# Patient Record
Sex: Male | Born: 1980
Health system: Southern US, Community
[De-identification: ages and names within clinical notes are randomized; demographics above are authoritative.]

## PROBLEM LIST (undated history)

## (undated) DIAGNOSIS — F32A Depression, unspecified: Secondary | ICD-10-CM

## (undated) DIAGNOSIS — R7302 Impaired glucose tolerance (oral): Secondary | ICD-10-CM

## (undated) DIAGNOSIS — E559 Vitamin D deficiency, unspecified: Secondary | ICD-10-CM

## (undated) DIAGNOSIS — E291 Testicular hypofunction: Secondary | ICD-10-CM

## (undated) DIAGNOSIS — K219 Gastro-esophageal reflux disease without esophagitis: Secondary | ICD-10-CM

## (undated) DIAGNOSIS — J45909 Unspecified asthma, uncomplicated: Secondary | ICD-10-CM

## (undated) DIAGNOSIS — R7989 Other specified abnormal findings of blood chemistry: Secondary | ICD-10-CM

## (undated) DIAGNOSIS — F329 Major depressive disorder, single episode, unspecified: Secondary | ICD-10-CM

## (undated) DIAGNOSIS — R945 Abnormal results of liver function studies: Principal | ICD-10-CM

## (undated) DIAGNOSIS — I10 Essential (primary) hypertension: Secondary | ICD-10-CM

## (undated) DIAGNOSIS — F419 Anxiety disorder, unspecified: Secondary | ICD-10-CM

## (undated) DIAGNOSIS — F988 Other specified behavioral and emotional disorders with onset usually occurring in childhood and adolescence: Secondary | ICD-10-CM

## (undated) HISTORY — PX: OTHER SURGICAL HISTORY: SHX169

## (undated) HISTORY — DX: Major depressive disorder, single episode, unspecified: F32.9

## (undated) HISTORY — DX: Unspecified asthma, uncomplicated: J45.909

## (undated) HISTORY — DX: Depression, unspecified: F32.A

## (undated) HISTORY — DX: Anxiety disorder, unspecified: F41.9

## (undated) HISTORY — DX: Impaired glucose tolerance (oral): R73.02

## (undated) HISTORY — DX: Other specified abnormal findings of blood chemistry: R79.89

## (undated) HISTORY — DX: Abnormal results of liver function studies: R94.5

## (undated) HISTORY — DX: Essential (primary) hypertension: I10

## (undated) HISTORY — DX: Vitamin D deficiency, unspecified: E55.9

## (undated) HISTORY — DX: Other specified behavioral and emotional disorders with onset usually occurring in childhood and adolescence: F98.8

## (undated) HISTORY — DX: Testicular hypofunction: E29.1

## (undated) HISTORY — DX: Gastro-esophageal reflux disease without esophagitis: K21.9

---

## 2000-03-28 ENCOUNTER — Encounter: Payer: Self-pay | Admitting: Family Medicine

## 2000-03-28 ENCOUNTER — Ambulatory Visit (HOSPITAL_COMMUNITY): Admission: RE | Admit: 2000-03-28 | Discharge: 2000-03-28 | Payer: Self-pay | Admitting: Family Medicine

## 2003-08-23 ENCOUNTER — Emergency Department (HOSPITAL_COMMUNITY): Admission: EM | Admit: 2003-08-23 | Discharge: 2003-08-23 | Payer: Self-pay | Admitting: Emergency Medicine

## 2006-08-03 ENCOUNTER — Emergency Department (HOSPITAL_COMMUNITY): Admission: EM | Admit: 2006-08-03 | Discharge: 2006-08-03 | Payer: Self-pay | Admitting: Family Medicine

## 2010-02-07 ENCOUNTER — Ambulatory Visit: Payer: Self-pay | Admitting: Diagnostic Radiology

## 2010-03-17 ENCOUNTER — Emergency Department (HOSPITAL_BASED_OUTPATIENT_CLINIC_OR_DEPARTMENT_OTHER): Admission: EM | Admit: 2010-03-17 | Discharge: 2010-02-07 | Payer: Self-pay | Admitting: Emergency Medicine

## 2010-06-22 LAB — CBC
HCT: 46.2 % (ref 39.0–52.0)
Hemoglobin: 16 g/dL (ref 13.0–17.0)
MCH: 30.1 pg (ref 26.0–34.0)
MCHC: 34.7 g/dL (ref 30.0–36.0)
MCV: 86.6 fL (ref 78.0–100.0)
Platelets: 268 10*3/uL (ref 150–400)
RBC: 5.33 MIL/uL (ref 4.22–5.81)
RDW: 13.1 % (ref 11.5–15.5)
WBC: 10.2 10*3/uL (ref 4.0–10.5)

## 2010-06-22 LAB — DIFFERENTIAL
Basophils Absolute: 0 10*3/uL (ref 0.0–0.1)
Basophils Relative: 0 % (ref 0–1)
Lymphocytes Relative: 17 % (ref 12–46)
Monocytes Absolute: 0.6 10*3/uL (ref 0.1–1.0)
Neutro Abs: 7.4 10*3/uL (ref 1.7–7.7)

## 2010-06-22 LAB — COMPREHENSIVE METABOLIC PANEL
Alkaline Phosphatase: 55 U/L (ref 39–117)
BUN: 26 mg/dL — ABNORMAL HIGH (ref 6–23)
CO2: 19 mEq/L (ref 19–32)
Chloride: 110 mEq/L (ref 96–112)
Creatinine, Ser: 1.4 mg/dL (ref 0.4–1.5)
GFR calc non Af Amer: 60 mL/min — ABNORMAL LOW (ref >60)
Glucose, Bld: 98 mg/dL (ref 70–99)
Potassium: 3.9 mEq/L (ref 3.5–5.1)
Total Bilirubin: 0.6 mg/dL (ref 0.3–1.2)

## 2011-08-10 ENCOUNTER — Encounter: Payer: Self-pay | Admitting: Internal Medicine

## 2011-08-31 ENCOUNTER — Encounter: Payer: Self-pay | Admitting: Internal Medicine

## 2011-09-05 ENCOUNTER — Other Ambulatory Visit: Payer: Self-pay | Admitting: Internal Medicine

## 2011-09-05 ENCOUNTER — Ambulatory Visit: Payer: BC Managed Care – PPO

## 2011-09-05 ENCOUNTER — Encounter: Payer: Self-pay | Admitting: Internal Medicine

## 2011-09-05 ENCOUNTER — Ambulatory Visit (INDEPENDENT_AMBULATORY_CARE_PROVIDER_SITE_OTHER): Payer: BC Managed Care – PPO | Admitting: Internal Medicine

## 2011-09-05 VITALS — BP 120/70 | HR 75 | Ht 69.0 in | Wt 183.8 lb

## 2011-09-05 DIAGNOSIS — R7401 Elevation of levels of liver transaminase levels: Secondary | ICD-10-CM

## 2011-09-05 DIAGNOSIS — E291 Testicular hypofunction: Secondary | ICD-10-CM

## 2011-09-05 DIAGNOSIS — R7989 Other specified abnormal findings of blood chemistry: Secondary | ICD-10-CM

## 2011-09-05 LAB — CBC WITH DIFFERENTIAL/PLATELET
Basophils Absolute: 0 10*3/uL (ref 0.0–0.1)
HCT: 46.7 % (ref 39.0–52.0)
Lymphs Abs: 0.8 10*3/uL (ref 0.7–4.0)
MCV: 90.2 fl (ref 78.0–100.0)
Monocytes Absolute: 0.4 10*3/uL (ref 0.1–1.0)
Neutrophils Relative %: 76.9 % (ref 43.0–77.0)
Platelets: 206 10*3/uL (ref 150.0–400.0)
RDW: 14.2 % (ref 11.5–14.6)
WBC: 6.1 10*3/uL (ref 4.5–10.5)

## 2011-09-05 LAB — IGA: IgA: 299 mg/dL (ref 68–378)

## 2011-09-05 LAB — IBC PANEL
Iron: 68 ug/dL (ref 42–165)
Saturation Ratios: 15.2 % — ABNORMAL LOW (ref 20.0–50.0)
Transferrin: 319.7 mg/dL (ref 212.0–360.0)

## 2011-09-05 LAB — HEPATIC FUNCTION PANEL
Bilirubin, Direct: 0.1 mg/dL (ref 0.0–0.3)
Total Bilirubin: 0.6 mg/dL (ref 0.3–1.2)

## 2011-09-05 LAB — PROTIME-INR: INR: 1 ratio (ref 0.8–1.0)

## 2011-09-05 LAB — FERRITIN: Ferritin: 50.6 ng/mL (ref 22.0–322.0)

## 2011-09-05 MED ORDER — ALIGN 4 MG PO CAPS: 1.0000 | ORAL_CAPSULE | Freq: Every day | ORAL | Status: DC

## 2011-09-05 NOTE — Patient Instructions (Signed)
You have been scheduled for an abdominal Ultrasound at [facility] on 5.30.2013  @ 9:00am  Please arrive 15 min prior to your appointment. DO NOT HAVE ANYTHING TO EAT OR DRINK AFTER MIDNIGHT before you appointment.  If you cannot make this appointment please call [phone].  You have been given samples of Align, you can purchase these over the counter at your local Drug store.  Dr. Rhea Belton would like you to stop using: Purify and Dominica.

## 2011-09-06 ENCOUNTER — Encounter: Payer: Self-pay | Admitting: Internal Medicine

## 2011-09-06 DIAGNOSIS — F419 Anxiety disorder, unspecified: Secondary | ICD-10-CM | POA: Insufficient documentation

## 2011-09-06 DIAGNOSIS — E291 Testicular hypofunction: Secondary | ICD-10-CM | POA: Insufficient documentation

## 2011-09-06 LAB — ANA: Anti Nuclear Antibody(ANA): NEGATIVE

## 2011-09-06 LAB — IRON AND TIBC
%SAT: 17 % — ABNORMAL LOW (ref 20–55)
TIBC: 411 ug/dL (ref 215–435)
UIBC: 341 ug/dL (ref 125–400)

## 2011-09-06 LAB — IGG: IgG (Immunoglobin G), Serum: 1270 mg/dL (ref 650–1600)

## 2011-09-06 LAB — CERULOPLASMIN: Ceruloplasmin: 28 mg/dL (ref 20–60)

## 2011-09-06 NOTE — Progress Notes (Signed)
Subjective:    Patient ID: Darren Freeman, male    DOB: 06/02/1980, 31 y.o.   MRN: [id number]  HPI Mr. Betzold is a 31 yo male with PMH of hypertension, anxiety, and hypogonadism with low testosterone who seen in consultation at the request of Dr. Oneta Rack for evaluation of elevated LFTs. The patient reports today that he feels well and is without symptoms. He has no prior history of abnormal liver tests to his knowledge. He reports that he has no specific GI symptoms other than frequent and foul-smelling gas. He reports no abdominal pain. No nausea or vomiting. No diarrhea or constipation. He's had no blood in his stools and no melena. No fevers and chills. Overall energy levels are good. His appetite remains good. He denies early satiety.  From a liver standpoint, he has no history of ascites, lower extremity edema, or jaundice. He has no family history of liver disease. He notes his mother has a history of diverticulosis and subsequent diverticulitis. No family history of celiac disease. No family history of colorectal cancer.  The patient was asked regarding over-the-counter and herbal supplementations. He is currently taking a medication called Purify which he describes as like a nightly detoxification. He is also taking a product called Dominica which he reports is a collection of vitamins and minerals.  Review of Systems As per history of present illness, otherwise notable for anxiety, occasional back pain, occasional headaches, and occasional sleeping problems.  Past Medical History  Diagnosis Date  . Hypogonadism male   . HTN (hypertension)   . Anxiety   . Elevated LFTs    Past Surgical History  Procedure Date  . Neg hx    Current Outpatient Prescriptions  Medication Sig Dispense Refill  . ALPHA LIPOIC ACID PO Take by mouth daily.      . Ascorbic Acid (VITAMIN C) 1000 MG tablet Take 1,000 mg by mouth daily. 1-2 qd      . buPROPion (WELLBUTRIN XL) 300 MG 24 hr tablet  Take 300 mg by mouth daily.      Marland Kitchen co-enzyme Q-10 30 MG capsule Take 30 mg by mouth daily.      . Fish Oil-Cholecalciferol (FISH OIL + D3) 1200-1000 MG-UNIT CAPS Take by mouth.      . lisdexamfetamine (VYVANSE) 40 MG capsule Take 40 mg by mouth every morning.      . Multiple Vitamin (MULTIVITAMIN) tablet Take 1 tablet by mouth daily.      Marland Kitchen OVER THE COUNTER MEDICATION Liver detox herbal supp      . TESTOSTERONE IM Inject into the muscle. 200mg  1.5cc q 3 weeks      . Probiotic Product (ALIGN) 4 MG CAPS Take 1 capsule by mouth daily.  30 capsule  2   No Known Allergies  Family History  Problem Relation Age of Onset  . Colon cancer Neg Hx   . Breast cancer Paternal Grandmother   ' History  Substance Use Topics  . Smoking status: Former Games developer  . Smokeless tobacco: Never Used  . Alcohol Use: No   patient works at a [employer] as a Production designer, theatre/television/film. He is not married. He has no children. He denies alcohol use completely, but notes several years ago he did drink a regular basis. He denies illicit drug use.      Objective:   Physical Exam BP 120/70  Pulse 75  Ht 5\' 9"  (1.753 m)  Wt 183 lb 12.8 oz (83.371 kg)  BMI 27.14 kg/m2  Constitutional: Well-developed and well-nourished. No distress. HEENT: Normocephalic and atraumatic. Oropharynx is clear and moist. No oropharyngeal exudate. Conjunctivae are normal. Pupils are equal round and reactive to light. No scleral icterus. Neck: Neck supple. Trachea midline. Cardiovascular: Normal rate, regular rhythm and intact distal pulses. No M/R/G Pulmonary/chest: Effort normal and breath sounds normal. No wheezing, rales or rhonchi. Abdominal: Soft, nontender, nondistended. Bowel sounds active throughout. There are no masses palpable. No hepatosplenomegaly. Extremities: no clubbing, cyanosis, or edema Lymphadenopathy: No cervical adenopathy noted. Neurological: Alert and oriented to person place and time. Skin: Skin is warm and dry. No  rashes noted. Psychiatric: Normal mood and affect. Behavior is normal.  CBC    Component Value Date/Time   WBC 6.1 09/05/2011 0954   RBC 5.19 09/05/2011 0954   HGB 15.4 09/05/2011 0954   HCT 46.7 09/05/2011 0954   PLT 206.0 09/05/2011 0954   MCV 90.2 09/05/2011 0954   MCH 30.1 02/07/2010 0131   MCHC 33.0 09/05/2011 0954   RDW 14.2 09/05/2011 0954   LYMPHSABS 0.8 09/05/2011 0954   MONOABS 0.4 09/05/2011 0954   EOSABS 0.1 09/05/2011 0954   BASOSABS 0.0 09/05/2011 0954   Labs obtained from his primary provider, dated 08/02/2011 Hepatitis C antibody negative, hepatitis Be antibody negative, hepatitis B surface antibody positive, hepatitis A antibody total negative, hepatitis B core antibody total negative Hematocrit 47.9, MCV 88.1, platelet count 252 Sodium 137, potassium 4.8, chloride 105, carbon dioxide 21, BUN 30, creatinine 1.27, glucose 82, calcium 9.6 Total bilirubin 0.4, alk phosphatase 32, AST 128, ALT 138, total protein 7.7, albumin 4.8 Technetium 2.2 TSH 1.496 Hemoglobin A1c 5.3 Testosterone 768.27 (reference range 300-890) Vitamin D 43  Of note labs dated 12/06/2010 -- AST 50, ALT 49, testosterone low at 36  Remote imaging reviewed, CT abdomen with contrast dated 02/07/2010 Negative CT abdomen. Tiny nonspecific low attenuation focus centrally and liver 6 mm diameter, too small to characterize. Liver, spleen, pancreas, kidneys, and adrenal glands otherwise unremarkable. No mass, adenopathy, or free fluid.     Assessment & Plan:  31 yo male with PMH of hypertension, anxiety, and hypogonadism with low testosterone who seen in consultation at the request of Dr. Oneta Rack for evaluation of elevated LFTs.  1. Elevated liver transaminases -- the patient has no signs of chronic liver disease, but he has had a relatively persistent transaminitis. His AST and ALT were elevated in October of 2011 during your visit to the emergency department. Of note he was taking testosterone, at that time not  by prescription for bodybuilding purposes. He also reports alcohol use during this period in late 2011. His current pattern of hepatocellular injury is not consistent with alcoholic hepatitis, and this is supported by the fact that he has a persistent transaminitis and no longer drinks alcohol. At this time, I feel the most likely etiology to his elevated transaminases continues to be drug related, specifically testosterone. It is interesting, that during a period in 2012 when he was off testosterone, his transaminases normalized. For today, I will perform additional testing to exclude other possible causes of liver inflammation, such as iron storage disease, Wilson's disease, autoimmune hepatitis, etc. I also have recommended a liver ultrasound with Doppler. Once these results are available, we will meet again and decide how to proceed. We discussed how at present his liver functions normally and I feel that he is at very low risk for decompensation at present. However, we certainly do not want this inflammation around unchecked for a long period.  I recommended that he discontinue all nonessential medications, including his over-the-counter vitamin supplementation. He voices understanding. I've also recommended hepatitis A vaccine, as he is not currently immune. We will give hepatitis A vaccine starting today.

## 2011-09-07 ENCOUNTER — Other Ambulatory Visit (HOSPITAL_COMMUNITY): Payer: BC Managed Care – PPO

## 2011-09-07 LAB — MITOCHONDRIAL ANTIBODIES: Mitochondrial M2 Ab, IgG: 0.65 (ref <0.91)

## 2011-09-12 ENCOUNTER — Other Ambulatory Visit (HOSPITAL_COMMUNITY): Payer: BC Managed Care – PPO

## 2011-09-21 ENCOUNTER — Other Ambulatory Visit: Payer: Self-pay | Admitting: *Deleted

## 2011-09-21 DIAGNOSIS — R7989 Other specified abnormal findings of blood chemistry: Secondary | ICD-10-CM

## 2011-09-21 DIAGNOSIS — R945 Abnormal results of liver function studies: Secondary | ICD-10-CM

## 2011-09-26 LAB — HIV-1 GENOTYPR PLUS

## 2011-11-17 ENCOUNTER — Telehealth: Payer: Self-pay | Admitting: *Deleted

## 2011-11-17 NOTE — Telephone Encounter (Signed)
lmom for pt to call back. Pt needs repeat labs.

## 2011-11-17 NOTE — Telephone Encounter (Signed)
Message copied by Florene Glen on Fri Nov 17, 2011  3:55 PM ------      Message from: Daphine Deutscher      Created: Thu Sep 21, 2011  4:16 PM       Call and remind patient due for LFT, coags for JMP on 11/20/11

## 2011-11-22 NOTE — Telephone Encounter (Signed)
lmom for pt to call back

## 2011-12-08 NOTE — Telephone Encounter (Signed)
Never heard back from pt; mailed him a letter requesting he come in for repeat LFT's.

## 2012-06-24 ENCOUNTER — Telehealth: Payer: Self-pay

## 2012-06-24 NOTE — Telephone Encounter (Signed)
Message copied by Annett Fabian on Mon Jun 24, 2012  9:15 AM ------      Message from: Annett Fabian      Created: Tue May 21, 2012  2:53 PM       Patient only received 1 of the Hep A injections please see if he needs to complete the series ------

## 2012-06-24 NOTE — Telephone Encounter (Signed)
Per Dr. Rhea Belton patient should complete the series and get a hep a antibody total in 08/24/12 I have left a message for the patient to call back to schedule injection #2

## 2012-07-08 NOTE — Telephone Encounter (Signed)
No return call from patient and no machine when I attempted to contact him today to leave a message.  I have mailed him a letter asking he call us to schedule his second Hep A shot and labs after.

## 2012-08-12 ENCOUNTER — Other Ambulatory Visit: Payer: Self-pay | Admitting: Gastroenterology

## 2012-08-12 ENCOUNTER — Telehealth: Payer: Self-pay | Admitting: Internal Medicine

## 2012-08-12 DIAGNOSIS — R7989 Other specified abnormal findings of blood chemistry: Secondary | ICD-10-CM

## 2012-08-12 NOTE — Telephone Encounter (Signed)
See phone note 06/24/12, he is due for Hep A # 2 now and will need labs after.  I have left a message for him to call and discuss

## 2012-08-12 NOTE — Telephone Encounter (Signed)
Pt called in scheduled his hep inj #2 for 08/13/2012 @ 2:00pm I also let him know that he is to go to the lab as well. Pt verbalized understanding

## 2012-08-13 ENCOUNTER — Other Ambulatory Visit (INDEPENDENT_AMBULATORY_CARE_PROVIDER_SITE_OTHER): Payer: BC Managed Care – PPO

## 2012-08-13 ENCOUNTER — Ambulatory Visit: Payer: BC Managed Care – PPO | Admitting: Internal Medicine

## 2012-08-13 ENCOUNTER — Other Ambulatory Visit: Payer: Self-pay | Admitting: Gastroenterology

## 2012-08-13 DIAGNOSIS — R7989 Other specified abnormal findings of blood chemistry: Secondary | ICD-10-CM

## 2012-08-13 DIAGNOSIS — R945 Abnormal results of liver function studies: Secondary | ICD-10-CM

## 2012-08-13 LAB — IRON AND TIBC
%SAT: 18 % — ABNORMAL LOW (ref 20–55)
Iron: 84 ug/dL (ref 42–165)

## 2012-08-13 LAB — HEPATIC FUNCTION PANEL
AST: 80 U/L — ABNORMAL HIGH (ref 0–37)
Bilirubin, Direct: 0.1 mg/dL (ref 0.0–0.3)
Total Bilirubin: 1 mg/dL (ref 0.3–1.2)

## 2012-08-14 LAB — PT AND PTT: INR: 1.1 (ref 0.8–1.2)

## 2012-08-16 ENCOUNTER — Encounter: Payer: Self-pay | Admitting: Internal Medicine

## 2012-08-20 ENCOUNTER — Ambulatory Visit: Payer: BC Managed Care – PPO | Admitting: Internal Medicine

## 2012-08-20 ENCOUNTER — Telehealth: Payer: Self-pay | Admitting: Internal Medicine

## 2012-08-21 ENCOUNTER — Encounter: Payer: Self-pay | Admitting: Internal Medicine

## 2012-08-22 ENCOUNTER — Encounter: Payer: Self-pay | Admitting: Internal Medicine

## 2012-08-22 ENCOUNTER — Ambulatory Visit (INDEPENDENT_AMBULATORY_CARE_PROVIDER_SITE_OTHER): Payer: BC Managed Care – PPO | Admitting: Internal Medicine

## 2012-08-22 VITALS — BP 130/84 | HR 72 | Ht 69.0 in | Wt 195.4 lb

## 2012-08-22 DIAGNOSIS — R7989 Other specified abnormal findings of blood chemistry: Secondary | ICD-10-CM

## 2012-08-22 DIAGNOSIS — R109 Unspecified abdominal pain: Secondary | ICD-10-CM

## 2012-08-22 MED ORDER — HYOSCYAMINE SULFATE 0.125 MG SL SUBL: 0.1250 mg | SUBLINGUAL_TABLET | SUBLINGUAL | Status: DC | PRN

## 2012-08-22 NOTE — Progress Notes (Signed)
Subjective:    Patient ID: Darren Freeman, male    DOB: 1980-12-26, 32 y.o.   MRN: [id number]  HPI Mr. Witherspoon is a 32 yo male with PMH of hypertension, anxiety, and hypogonadism with low testosterone, elevated serum transaminases who seen in followup.  I saw Jlon one year ago to evaluate his elevated AST and ALT. Lab evaluation did not reveal an etiology for his elevated liver enzymes. At that time he was felt possibly drug-induced, specifically testosterone. Today he is feeling well, though he is getting over a recent GI illness. He reports this effected his entire family involved fever, nausea with vomiting , and abdominal cramping. This is significantly better, though he still feels postprandial abdominal bloating. After his last visit he was using Align which he thought helped with his abdominal bloating. He's had no jaundice, lower extremity edema, abdominal pain or increased girth, itching, or bleeding.  No fevers, chills, rashes. He's had no alcohol consumption for over a year  Review of Systems As per history of present illness, otherwise negative  Current Medications, Allergies, Past Medical History, Past Surgical History, Family History and Social History were reviewed in [employer] record.     Objective:   Physical Exam BP 130/84  Pulse 72  Ht 5\' 9"  (1.753 m)  Wt 195 lb 6 oz (88.622 kg)  BMI 28.84 kg/m2 Constitutional: Well-developed and well-nourished. No distress. HEENT: Normocephalic and atraumatic.No scleral icterus. Abdominal: Soft, nontender, nondistended. Bowel sounds active throughout. There are no masses palpable. No hepatosplenomegaly. Extremities: no clubbing, cyanosis, or edema Neurological: Alert and oriented to person place and time. Skin: Skin is warm and dry. No rashes noted. Psychiatric: Normal mood and affect. Behavior is normal.  CBC    Component Value Date/Time   WBC 6.1 09/05/2011 0954   RBC 5.19 09/05/2011 0954   HGB  15.4 09/05/2011 0954   HCT 46.7 09/05/2011 0954   PLT 206.0 09/05/2011 0954   MCV 90.2 09/05/2011 0954   MCH 30.1 02/07/2010 0131   MCHC 33.0 09/05/2011 0954   RDW 14.2 09/05/2011 0954   LYMPHSABS 0.8 09/05/2011 0954   MONOABS 0.4 09/05/2011 0954   EOSABS 0.1 09/05/2011 0954   BASOSABS 0.0 09/05/2011 0954    INR 1.1  Results for ESCHER, HARR (MRN [id number]) as of 08/22/2012 13:16  Ref. Range 02/07/2010 01:31 09/05/2011 09:54 09/05/2011 09:54 08/13/2012 14:10  Alkaline Phosphatase Latest Range: 39-117 U/L 55 26 (L)  41  Albumin Latest Range: 3.5-5.2 g/dL 4.7 3.8  4.0  AST Latest Range: 0-37 U/L 81 (H) 84 (H)  80 (H)  ALT Latest Range: 0-53 U/L 78 (H) 103 (H)  124 (H)  Total Protein Latest Range: 6.0-8.3 g/dL 8.2 7.4  7.3  Bilirubin, Direct Latest Range: 0.0-0.3 mg/dL  0.1  0.1  Total Bilirubin Latest Range: 0.3-1.2 mg/dL 0.6 0.6  1.0   Previous labs: Alpha-1-antitrypsin - normal IgG normal ANA negative Ferritin 50 Percent sat 17% Iron 70 TIBC 411 Tissue transglutaminase normal, IgA normal AMA negative Anti-smooth muscle antibody negative Ceruloplasmin 28      Assessment & Plan:  32 yo male with PMH of hypertension, anxiety, and hypogonadism with low testosterone, elevated serum transaminases who seen in followup.  1.  Elevated liver enzymes -- his liver enzymes have remained persistently elevated and AST at last check was 80, ALT 124.  There is no evidence of chronic liver disease/decompensation, portal hypertension, etc. My impression is this is still likely related to testosterone replacement. He  did try stopping this medication in the past, and had significant fatigue, loss of stamina, et Karie Soda.  We discussed that chronic, low-grade liver inflammation, and results over time in scarring.  At this point, we discussed liver biopsy versus referral to Dr. Corky Sing for hepatology opinion.  He understands that even with hepatology opinion, he may need a liver biopsy. We will arrange for  him to be seen at [facility] by Dr. Esmond Harps.  He will continue to avoid alcohol and any over-the-counter herbal supplements.  2.  Resolving gastroenteritis, likely viral -- L2 and a prescription for Levsin to be used as needed and as directed for abdominal cramping/spasm. I do not anticipate he will need this for very long.  Align may also help this as he is getting over this infection.  He also seemed to have a favorable response to Align when used in the past for bloating and gas.  Return after opinion from Dr. Corky Sing

## 2012-08-22 NOTE — Patient Instructions (Addendum)
You will be referred to [facility] gastroenterology Dr. Corky Sing, his office will be contacting you.  If you do not hear from them by 08/30/2012 please call our office [phone] ext [phone]  Resume taking Align daily                                               We are excited to introduce MyChart, a new best-in-class service that provides you online access to important information in your electronic medical record. We want to make it easier for you to view your health information - all in one secure location - when and where you need it. We expect MyChart will enhance the quality of care and service we provide.  When you register for MyChart, you can:    View your test results.    Request appointments and receive appointment reminders via email.    Request medication renewals.    View your medical history, allergies, medications and immunizations.    Communicate with your physician's office through a password-protected site.    Conveniently print information such as your medication lists.  To find out if MyChart is right for you, please talk to a member of our clinical staff today. We will gladly answer your questions about this free health and wellness tool.  If you are age 49 or older and want a member of your family to have access to your record, you must provide written consent by completing a proxy form available at our office. Please speak to our clinical staff about guidelines regarding accounts for patients younger than age 72.  As you activate your MyChart account and need any technical assistance, please call the MyChart technical support line at [phone]-[phone]) or email your question to [email or web address]. If you email your question(s), please include your name, a return phone number and the best time to reach you.  If you have non-urgent health-related questions, you can send a message to our office through MyChart at [email or web address]. If you have a medical  emergency, call 911.  Thank you for using MyChart as your new health and wellness resource!   MyChart licensed from [employer],  [id number]. Patents Pending.

## 2012-08-23 ENCOUNTER — Telehealth: Payer: Self-pay | Admitting: Gastroenterology

## 2012-08-23 NOTE — Telephone Encounter (Signed)
Faxed records for referral to Dr. Corky Sing @ [facility]

## 2012-12-25 NOTE — Telephone Encounter (Signed)
PT NOT BILLED °

## 2013-01-08 ENCOUNTER — Other Ambulatory Visit: Payer: Self-pay | Admitting: Internal Medicine

## 2013-01-08 DIAGNOSIS — R0602 Shortness of breath: Secondary | ICD-10-CM

## 2013-01-08 DIAGNOSIS — I10 Essential (primary) hypertension: Secondary | ICD-10-CM

## 2013-01-08 DIAGNOSIS — L0231 Cutaneous abscess of buttock: Secondary | ICD-10-CM

## 2013-01-09 ENCOUNTER — Other Ambulatory Visit: Payer: BC Managed Care – PPO

## 2013-01-13 ENCOUNTER — Other Ambulatory Visit: Payer: BC Managed Care – PPO

## 2013-01-28 ENCOUNTER — Other Ambulatory Visit (HOSPITAL_COMMUNITY): Payer: Self-pay | Admitting: Internal Medicine

## 2013-01-28 ENCOUNTER — Ambulatory Visit (HOSPITAL_COMMUNITY)
Admission: RE | Admit: 2013-01-28 | Discharge: 2013-01-28 | Disposition: A | Discharge status: Home / Self Care | Payer: BC Managed Care – PPO | Source: Ambulatory Visit | Attending: Internal Medicine | Admitting: Internal Medicine

## 2013-01-28 DIAGNOSIS — M545 Low back pain, unspecified: Secondary | ICD-10-CM | POA: Insufficient documentation

## 2013-01-28 DIAGNOSIS — R0602 Shortness of breath: Secondary | ICD-10-CM

## 2013-01-28 DIAGNOSIS — M5137 Other intervertebral disc degeneration, lumbosacral region: Secondary | ICD-10-CM | POA: Insufficient documentation

## 2013-01-28 DIAGNOSIS — I319 Disease of pericardium, unspecified: Secondary | ICD-10-CM | POA: Insufficient documentation

## 2013-01-28 DIAGNOSIS — M4040 Postural lordosis, site unspecified: Secondary | ICD-10-CM | POA: Insufficient documentation

## 2013-01-28 DIAGNOSIS — M79609 Pain in unspecified limb: Secondary | ICD-10-CM

## 2013-01-28 DIAGNOSIS — M51379 Other intervertebral disc degeneration, lumbosacral region without mention of lumbar back pain or lower extremity pain: Secondary | ICD-10-CM | POA: Insufficient documentation

## 2013-01-28 DIAGNOSIS — I517 Cardiomegaly: Secondary | ICD-10-CM | POA: Insufficient documentation

## 2013-01-29 ENCOUNTER — Other Ambulatory Visit: Payer: Self-pay | Admitting: *Deleted

## 2013-01-29 ENCOUNTER — Encounter: Payer: Self-pay | Admitting: Internal Medicine

## 2013-01-29 ENCOUNTER — Ambulatory Visit (HOSPITAL_COMMUNITY): Payer: BC Managed Care – PPO | Attending: Internal Medicine | Admitting: Cardiology

## 2013-01-29 ENCOUNTER — Ambulatory Visit (INDEPENDENT_AMBULATORY_CARE_PROVIDER_SITE_OTHER): Payer: BC Managed Care – PPO | Admitting: Internal Medicine

## 2013-01-29 VITALS — BP 148/90 | HR 75 | Ht 69.0 in | Wt 199.5 lb

## 2013-01-29 DIAGNOSIS — I77819 Aortic ectasia, unspecified site: Secondary | ICD-10-CM | POA: Insufficient documentation

## 2013-01-29 DIAGNOSIS — R0602 Shortness of breath: Secondary | ICD-10-CM

## 2013-01-29 DIAGNOSIS — I10 Essential (primary) hypertension: Secondary | ICD-10-CM

## 2013-01-29 DIAGNOSIS — R0609 Other forms of dyspnea: Secondary | ICD-10-CM | POA: Insufficient documentation

## 2013-01-29 DIAGNOSIS — I517 Cardiomegaly: Secondary | ICD-10-CM

## 2013-01-29 DIAGNOSIS — I319 Disease of pericardium, unspecified: Secondary | ICD-10-CM

## 2013-01-29 DIAGNOSIS — I379 Nonrheumatic pulmonary valve disorder, unspecified: Secondary | ICD-10-CM | POA: Insufficient documentation

## 2013-01-29 DIAGNOSIS — I77810 Thoracic aortic ectasia: Secondary | ICD-10-CM

## 2013-01-29 DIAGNOSIS — R0989 Other specified symptoms and signs involving the circulatory and respiratory systems: Secondary | ICD-10-CM | POA: Insufficient documentation

## 2013-01-29 DIAGNOSIS — I059 Rheumatic mitral valve disease, unspecified: Secondary | ICD-10-CM | POA: Insufficient documentation

## 2013-01-29 MED ORDER — HYDROCHLOROTHIAZIDE 12.5 MG PO CAPS: 12.5000 mg | ORAL_CAPSULE | Freq: Every day | ORAL | Status: DC

## 2013-01-29 NOTE — Progress Notes (Signed)
Echo performed. 

## 2013-01-29 NOTE — Progress Notes (Signed)
Primary Care Physician: Nadean Corwin, MD Referring Physician:  Quentin Mulling PA   Darren Freeman is a 32 y.o. male with a h/o htn who presents for cardiology consultation.  He is a Pharmacist, community and works out frequent.  He also diets aggressively (lose 20 lbs in a week) and then after a body building competition he will eat a lot and gain 20 lbs back in a week.  He reports that his most recent competition was 3 weeks ago.  He lost 20 lbs for this competition and then over the past 2-3 weeks, he gained 25 lbs back.  His diet has changed and this over the past 2 weeks has included significant increase in carbohydrates and sodium.  He presented to Dr Kathryne Sharper office for symptoms of lethargy recently.  He was found to have high blood sugar (bs 400s) several weeks ago.  Over the past few days, he has also noticed an increase in SOB.  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  He continues to workout without difficulty.  He has had occasional headaches recently.  He reports occasional gradual onset/offset of heart palpitations but denies sustained or abrupt onset of tachypalpitations.  Today, he denies symptoms of  chest pain, orthopnea, PND, lower extremity edema, dizziness, presyncope, syncope, or neurologic sequela. The patient is tolerating medications without difficulties and is otherwise without complaint today.   Past Medical History  Diagnosis Date  . Hypogonadism male   . HTN (hypertension)   . Anxiety   . Elevated LFTs   . Glucose intolerance (impaired glucose tolerance)     previously on metformin   Past Surgical History  Procedure Laterality Date  . Neg hx      Current Outpatient Prescriptions  Medication Sig Dispense Refill  . ALPHA LIPOIC ACID PO Take by mouth 2 (two) times daily.       . Ascorbic Acid (VITAMIN C) 1000 MG tablet Take 1,000 mg by mouth daily. 1-2 qd      . buPROPion (WELLBUTRIN XL) 300 MG 24 hr tablet Take 300 mg by mouth  daily.      . Fish Oil-Cholecalciferol (FISH OIL + D3) 1200-1000 MG-UNIT CAPS Take by mouth 4 (four) times daily.       . hyoscyamine (LEVSIN SL) 0.125 MG SL tablet Place 1 tablet (0.125 mg total) under the tongue every 4 (four) hours as needed for cramping.  30 tablet  0  . Multiple Vitamin (MULTIVITAMIN) tablet Take 1 tablet by mouth daily.      . Probiotic Product (ALIGN) 4 MG CAPS Take 1 capsule by mouth daily.  30 capsule  2  . TESTOSTERONE IM Inject into the muscle. 200mg  1.5cc q 3 weeks      . trazodone (DESYREL) 300 MG tablet Take 150 mg by mouth at bedtime.      Marland Kitchen amphetamine-dextroamphetamine (ADDERALL) 20 MG tablet Take 10-20 mg by mouth 3 (three) times daily. Takes as needed       No current facility-administered medications for this visit.    No Known Allergies  History   Social History  . Marital Status: Single    Spouse Name: N/A    Number of Children: 0  . Years of Education: N/A   Occupational History  . manager [employer]    Social History Main Topics  . Smoking status: Former Games developer  . Smokeless tobacco: Never Used  . Alcohol Use: No     Comment: previously used cocaine (quit  x 2 years)  . Drug Use: Yes  . Sexual Activity: Not on file   Other Topics Concern  . 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 Colon cancer Neg Hx   . Breast cancer Paternal Grandmother     ROS- All systems are reviewed and negative except as per the HPI above  Physical Exam: Filed Vitals:   01/29/13 1704  BP: 148/90  Pulse: 75  Height: 5\' 9"  (1.753 m)  Weight: 199 lb 8 oz (90.493 kg)    GEN- The patient is well appearing, alert and oriented x 3 today.   Head- normocephalic, atraumatic Eyes-  Sclera clear, conjunctiva pink Ears- hearing intact Oropharynx- clear Neck- supple, no JVP Lymph- no cervical lymphadenopathy Lungs-  Clear to ausculation bilaterally, normal work of breathing Heart- Regular rate and rhythm, no murmurs, rubs or gallops, PMI not laterally displaced GI- soft, NT, ND, + BS Extremities- no clubbing, cyanosis, or edema MS- very fit Skin- no rash or lesion Psych- euthymic mood, full affect Neuro- strength and sensation are intact  EKG today reveals sinus rhythm 76 bpm, LVH Echo is reviewed and reveals mild LVH, preserved EF, mildly dilated aortic root  Assessment and Plan:  1. SOB The patient presents today for urgent add on visit for cardiology assessment of shortness of breath.  On exam today he is very fit but not significantly volume overloaded.  His BP is elevated.  He is stressing his body to extremes with weight loss prior to muscle building competitions followed by a very high carbohydrate and sodium diet with quick weight gain afterwards.  His extremes in diet and lifestyle have led to recent hyperglycemic events, elevated LFTs, and I believe have also caused his SOB and edema.  His echo today reveals mild LVH but preserved EF.  There is no evidence of cardiomyopathy.   I had a long discussion with the patient today about a more moderate approach to his lifestyle.  He is aware of the extremes that he is placing on his body and will try to make changes.  2. HTN Above goal Given recent swelling/ SOB with salt intake, I will start hctz 12.5mg  daily today He will return for follow-up with Tereso Newcomer in 6 weeks  3. Mildly dilated aortic root I have discouraged isometric exercise (especially extreme weight lifting) Repeat echo in 1 year  He will follow up with primary care as scheduled.

## 2013-01-29 NOTE — Patient Instructions (Addendum)
Your physician recommends that you schedule a follow-up appointment in: 6 weeks with Darren Freeman    Your physician has requested that you have an echocardiogram. Echocardiography is a painless test that uses sound waves to create images of your heart. It provides your doctor with information about the size and shape of your heart and how well your heart's chambers and valves are working. This procedure takes approximately one hour. There are no restrictions for this procedure.  This will be done in one year  Your physician has recommended you make the following change in your medication:  1) Start HCTZ 12.5 mg daily

## 2013-02-11 ENCOUNTER — Telehealth: Payer: Self-pay | Admitting: *Deleted

## 2013-02-11 MED ORDER — LISDEXAMFETAMINE DIMESYLATE 40 MG PO CAPS: 40.0000 mg | ORAL_CAPSULE | ORAL | Status: DC

## 2013-02-11 NOTE — Telephone Encounter (Signed)
rx prtd

## 2013-02-19 ENCOUNTER — Other Ambulatory Visit (HOSPITAL_COMMUNITY): Payer: BC Managed Care – PPO

## 2013-03-10 ENCOUNTER — Other Ambulatory Visit: Payer: Self-pay | Admitting: Physician Assistant

## 2013-03-10 MED ORDER — LISDEXAMFETAMINE DIMESYLATE 40 MG PO CAPS: 40.0000 mg | ORAL_CAPSULE | ORAL | Status: DC

## 2013-03-12 ENCOUNTER — Ambulatory Visit: Payer: BC Managed Care – PPO | Admitting: Physician Assistant

## 2013-04-07 ENCOUNTER — Other Ambulatory Visit: Payer: Self-pay | Admitting: Internal Medicine

## 2013-04-07 DIAGNOSIS — F988 Other specified behavioral and emotional disorders with onset usually occurring in childhood and adolescence: Secondary | ICD-10-CM

## 2013-04-07 MED ORDER — LISDEXAMFETAMINE DIMESYLATE 40 MG PO CAPS: 40.0000 mg | ORAL_CAPSULE | ORAL | Status: DC

## 2013-04-11 ENCOUNTER — Ambulatory Visit: Payer: Self-pay | Admitting: Internal Medicine

## 2013-04-21 DIAGNOSIS — E559 Vitamin D deficiency, unspecified: Secondary | ICD-10-CM | POA: Insufficient documentation

## 2013-04-21 DIAGNOSIS — F988 Other specified behavioral and emotional disorders with onset usually occurring in childhood and adolescence: Secondary | ICD-10-CM | POA: Insufficient documentation

## 2013-04-22 ENCOUNTER — Encounter: Payer: Self-pay | Admitting: Internal Medicine

## 2013-04-22 ENCOUNTER — Other Ambulatory Visit: Payer: Self-pay | Admitting: Internal Medicine

## 2013-04-22 ENCOUNTER — Ambulatory Visit (INDEPENDENT_AMBULATORY_CARE_PROVIDER_SITE_OTHER): Payer: BC Managed Care – PPO | Admitting: Internal Medicine

## 2013-04-22 VITALS — BP 136/88 | HR 64 | Temp 98.1°F | Resp 16 | Wt 202.2 lb

## 2013-04-22 DIAGNOSIS — R740 Nonspecific elevation of levels of transaminase and lactic acid dehydrogenase [LDH]: Secondary | ICD-10-CM

## 2013-04-22 DIAGNOSIS — N529 Male erectile dysfunction, unspecified: Secondary | ICD-10-CM

## 2013-04-22 DIAGNOSIS — R7309 Other abnormal glucose: Secondary | ICD-10-CM

## 2013-04-22 DIAGNOSIS — R7402 Elevation of levels of lactic acid dehydrogenase (LDH): Secondary | ICD-10-CM

## 2013-04-22 DIAGNOSIS — R5383 Other fatigue: Principal | ICD-10-CM

## 2013-04-22 DIAGNOSIS — R7401 Elevation of levels of liver transaminase levels: Secondary | ICD-10-CM

## 2013-04-22 DIAGNOSIS — Z79899 Other long term (current) drug therapy: Secondary | ICD-10-CM

## 2013-04-22 DIAGNOSIS — R5381 Other malaise: Secondary | ICD-10-CM

## 2013-04-22 DIAGNOSIS — F988 Other specified behavioral and emotional disorders with onset usually occurring in childhood and adolescence: Secondary | ICD-10-CM

## 2013-04-22 DIAGNOSIS — E559 Vitamin D deficiency, unspecified: Secondary | ICD-10-CM

## 2013-04-22 LAB — CBC WITH DIFFERENTIAL/PLATELET
BASOS ABS: 0 10*3/uL (ref 0.0–0.1)
Basophils Relative: 0 % (ref 0–1)
EOS ABS: 0.1 10*3/uL (ref 0.0–0.7)
EOS PCT: 1 % (ref 0–5)
HCT: 46 % (ref 39.0–52.0)
Hemoglobin: 15.6 g/dL (ref 13.0–17.0)
Lymphocytes Relative: 19 % (ref 12–46)
Lymphs Abs: 1.3 10*3/uL (ref 0.7–4.0)
MCH: 29.6 pg (ref 26.0–34.0)
MCHC: 33.9 g/dL (ref 30.0–36.0)
MCV: 87.3 fL (ref 78.0–100.0)
Monocytes Absolute: 0.5 10*3/uL (ref 0.1–1.0)
Monocytes Relative: 7 % (ref 3–12)
Neutro Abs: 5.1 10*3/uL (ref 1.7–7.7)
Neutrophils Relative %: 73 % (ref 43–77)
PLATELETS: 243 10*3/uL (ref 150–400)
RBC: 5.27 MIL/uL (ref 4.22–5.81)
RDW: 14.4 % (ref 11.5–15.5)
WBC: 7 10*3/uL (ref 4.0–10.5)

## 2013-04-22 MED ORDER — CITALOPRAM HYDROBROMIDE 40 MG PO TABS: 40.0000 mg | ORAL_TABLET | Freq: Every day | ORAL | Status: DC

## 2013-04-22 MED ORDER — AMPHETAMINE-DEXTROAMPHETAMINE 20 MG PO TABS: ORAL_TABLET | ORAL | Status: DC

## 2013-04-22 MED ORDER — AMPHETAMINE-DEXTROAMPHETAMINE 20 MG PO TABS: 20.0000 mg | ORAL_TABLET | Freq: Every day | ORAL | Status: DC

## 2013-04-22 NOTE — Progress Notes (Signed)
   Subjective:    Patient ID: Darren Freeman, male    DOB: [birth year], 33 y.o.   MRN: [id number]  HPI Very nice 33 yo SWM presenting with c/o mood changes and apathy - does have prior hx/o depression no motivation -  denies crying - denies suicidal ideations - reports malaise and fatigue     Review of Systems  Constitutional: Positive for activity change and fatigue. Negative for fever, chills, diaphoresis, appetite change and unexpected weight change.  HENT: Negative.   Eyes: Negative.   Respiratory: Negative.   Cardiovascular: Negative.   Gastrointestinal: Negative.   Endocrine: Negative.   Genitourinary: Negative.   Allergic/Immunologic: Negative.   Neurological: Negative.        Objective:   Physical Exam  Constitutional: He is oriented to person, place, and time. He appears well-developed and well-nourished. No distress.  HENT:  Head: Normocephalic and atraumatic.  Mouth/Throat: No oropharyngeal exudate.  Eyes: EOM are normal. Pupils are equal, round, and reactive to light.  Neck: Normal range of motion. Neck supple. No thyromegaly present.  Cardiovascular: Normal rate, regular rhythm and normal heart sounds.   No murmur heard. Pulmonary/Chest: Effort normal and breath sounds normal.  Abdominal: Soft.  Musculoskeletal: Normal range of motion.  Lymphadenopathy:    He has no cervical adenopathy.  Neurological: He is alert and oriented to person, place, and time. He has normal reflexes.  Skin: Skin is warm and dry. No rash noted. He is not diaphoretic.  Psychiatric: His behavior is normal. Judgment and thought content normal.  Flat affect           Assessment & Plan:  1. Other malaise and fatigue  - CBC with Differential - TSH - citalopram (CELEXA) 40 MG tablet; Take 1 tablet (40 mg total) by mouth daily.  Dispense: 30 tablet; Refill: 2 - Vitamin B12  2. Unspecified vitamin D deficiency  - Vit D  25 hydroxy (rtn osteoporosis monitoring)  3.  Impotence of organic origin  - Testosterone  4. Nonspecific elevation of levels of transaminase or lactic acid dehydrogenase (LDH)  - Hepatic function panel  5. ADD (attention deficit disorder)  - amphetamine-dextroamphetamine (ADDERALL) 20 MG tablet; 1/2 to 1 tablet 3 x da as needed for ADD  Dispense: 90 tablet; Refill: 0   ROV - 6 weeks

## 2013-04-22 NOTE — Patient Instructions (Signed)
Testosterone injection What is this medicine? TESTOSTERONE (tes TOS ter one) is the main male hormone. It supports normal male development such as muscle growth, facial hair, and deep voice. It is used in males to treat low testosterone levels. This medicine may be used for other purposes; ask your health care provider or pharmacist if you have questions. COMMON BRAND NAME(S): Andro-L.A., Aveed, Delatestryl, Depo-Testosterone, Virilon What should I tell my health care provider before I take this medicine? They need to know if you have any of these conditions: -breast cancer -diabetes -heart disease -kidney disease -liver disease -lung disease -prostate cancer, enlargement -an unusual or allergic reaction to testosterone, other medicines, foods, dyes, or preservatives -pregnant or trying to get pregnant -breast-feeding How should I use this medicine? This medicine is for injection into a muscle. It is usually given by a health care professional in a hospital or clinic setting. Contact your pediatrician regarding the use of this medicine in children. While this medicine may be prescribed for children as young as 2 years of age for selected conditions, precautions do apply. Overdosage: If you think you have taken too much of this medicine contact a poison control center or emergency room at once. NOTE: This medicine is only for you. Do not share this medicine with others. What if I miss a dose? Try not to miss a dose. Your doctor or health care professional will tell you when your next injection is due. Notify the office if you are unable to keep an appointment. What may interact with this medicine? -medicines for diabetes -medicines that treat or prevent blood clots like warfarin -oxyphenbutazone -propranolol -steroid medicines like prednisone or cortisone This list may not describe all possible interactions. Give your health care provider a list of all the medicines, herbs,  non-prescription drugs, or dietary supplements you use. Also tell them if you smoke, drink alcohol, or use illegal drugs. Some items may interact with your medicine. What should I watch for while using this medicine? Visit your doctor or health care professional for regular checks on your progress. They will need to check the level of testosterone in your blood. This medicine may affect blood sugar levels. If you have diabetes, check with your doctor or health care professional before you change your diet or the dose of your diabetic medicine. This drug is banned from use in athletes by most athletic organizations. What side effects may I notice from receiving this medicine? Side effects that you should report to your doctor or health care professional as soon as possible: -allergic reactions like skin rash, itching or hives, swelling of the face, lips, or tongue -breast enlargement -breathing problems -changes in mood, especially anger, depression, or rage -dark urine -general ill feeling or flu-like symptoms -light-colored stools -loss of appetite, nausea -nausea, vomiting -right upper belly pain -stomach pain -swelling of ankles -too frequent or persistent erections -trouble passing urine or change in the amount of urine -unusually weak or tired -yellowing of the eyes or skin Additional side effects that can occur in women include: -deep or hoarse voice -facial hair growth -irregular menstrual periods Side effects that usually do not require medical attention (report to your doctor or health care professional if they continue or are bothersome): -acne -change in sex drive or performance -hair loss -headache This list may not describe all possible side effects. Call your doctor for medical advice about side effects. You may report side effects to FDA at 1-800-FDA-1088. Where should I keep my medicine? Keep  out of the reach of children. This medicine can be abused. Keep your  medicine in a safe place to protect it from theft. Do not share this medicine with anyone. Selling or giving away this medicine is dangerous and against the law. Store at room temperature between 20 and 25 degrees C (68 and 77 degrees F). Do not freeze. Protect from light. Follow the directions for the product you are prescribed. Throw away any unused medicine after the expiration date. NOTE: This sheet is a summary. It may not cover all possible information. If you have questions about this medicine, talk to your doctor, pharmacist, or health care provider.  2014, Elsevier/Gold Standard. (2007-06-07 16:13:46) Depression, Adult Depression refers to feeling sad, low, down in the dumps, blue, gloomy, or empty. In general, there are two kinds of depression: 1. Depression that we all experience from time to time because of upsetting life experiences, including the loss of a job or the ending of a relationship (normal sadness or normal grief). This kind of depression is considered normal, is short lived, and resolves within a few days to 2 weeks. (Depression experienced after the loss of a loved one is called bereavement. Bereavement often lasts longer than 2 weeks but normally gets better with time.) 2. Clinical depression, which lasts longer than normal sadness or normal grief or interferes with your ability to function at home, at work, and in school. It also interferes with your personal relationships. It affects almost every aspect of your life. Clinical depression is an illness. Symptoms of depression also can be caused by conditions other than normal sadness and grief or clinical depression. Examples of these conditions are listed as follows:  Physical illness Some physical illnesses, including underactive thyroid gland (hypothyroidism), severe anemia, specific types of cancer, diabetes, uncontrolled seizures, heart and lung problems, strokes, and chronic pain are commonly associated with symptoms of  depression.  Side effects of some prescription medicine In some people, certain types of prescription medicine can cause symptoms of depression.  Substance abuse Abuse of alcohol and illicit drugs can cause symptoms of depression. SYMPTOMS Symptoms of normal sadness and normal grief include the following:  Feeling sad or crying for short periods of time.  Not caring about anything (apathy).  Difficulty sleeping or sleeping too much.  No longer able to enjoy the things you used to enjoy.  Desire to be by oneself all the time (social isolation).  Lack of energy or motivation.  Difficulty concentrating or remembering.  Change in appetite or weight.  Restlessness or agitation. Symptoms of clinical depression include the same symptoms of normal sadness or normal grief and also the following symptoms:  Feeling sad or crying all the time.  Feelings of guilt or worthlessness.  Feelings of hopelessness or helplessness.  Thoughts of suicide or the desire to harm yourself (suicidal ideation).  Loss of touch with reality (psychotic symptoms). Seeing or hearing things that are not real (hallucinations) or having false beliefs about your life or the people around you (delusions and paranoia). DIAGNOSIS  The diagnosis of clinical depression usually is based on the severity and duration of the symptoms. Your caregiver also will ask you questions about your medical history and substance use to find out if physical illness, use of prescription medicine, or substance abuse is causing your depression. Your caregiver also may order blood tests. TREATMENT  Typically, normal sadness and normal grief do not require treatment. However, sometimes antidepressant medicine is prescribed for bereavement to ease the depressive  symptoms until they resolve. The treatment for clinical depression depends on the severity of your symptoms but typically includes antidepressant medicine, counseling with a mental  health professional, or a combination of both. Your caregiver will help to determine what treatment is best for you. Depression caused by physical illness usually goes away with appropriate medical treatment of the illness. If prescription medicine is causing depression, talk with your caregiver about stopping the medicine, decreasing the dose, or substituting another medicine. Depression caused by abuse of alcohol or illicit drugs abuse goes away with abstinence from these substances. Some adults need professional help in order to stop drinking or using drugs. SEEK IMMEDIATE CARE IF:  You have thoughts about hurting yourself or others.  You lose touch with reality (have psychotic symptoms).  You are taking medicine for depression and have a serious side effect. FOR MORE INFORMATION National Alliance on Mental Illness: www.nami.[employer] National Institute of Mental Health: http://www.maynard.net/www.nimh.nih.gov Document Released: 03/24/2000 Document Revised: 09/26/2011 Document Reviewed: 06/26/2011 [facility] Patient Information 2014 [place], [place].  Testosterone This test is used to determine if your testosterone level is abnormal. This could be used to explain difficulty getting an erection (erectile dysfunction), inability of your partner to get pregnant (infertility), premature or delayed puberty if you are male, or the appearance of masculine physical features if you are male. PREPARATION FOR TEST A blood sample is obtained by inserting a needle into a vein in the arm. NORMAL FINDINGS  Free Testosterone: 0.3-2 pg/mL  % Free Testosterone: 0.1%-0.3% Total Testosterone:  7 mos-9 yrs (Tanner Stage I)  Male: Less than 30 ng/dL  Male: Less than 30 ng/dL  16-1010-13 yrs (Tanner Stage II)  Male: Less than 300 ng/dL  Male: Less than 40 ng/dL  96-0414-15 yrs (Tanner Stage III)  Male: 170-540 ng/dL  Male: Less than 60 ng/dL  54-0916-19 yrs (Tanner Stage IV, V)  Male: 250-910 ng/dL  Male: Less than 70  ng/dL  20 yrs and over  Male: [phone] ng/dL  Male: Less than 70 ng/dL Ranges for normal findings may vary among different laboratories and hospitals. You should always check with your doctor after having lab work or other tests done to discuss the meaning of your test results and whether your values are considered within normal limits. MEANING OF TEST  Your caregiver will go over the test results with you and discuss the importance and meaning of your results, as well as treatment options and the need for additional tests if necessary. OBTAINING THE TEST RESULTS It is your responsibility to obtain your test results. Ask the lab or department performing the test when and how you will get your results. Document Released: 04/13/2004 Document Revised: 06/19/2011 Document Reviewed: 03/08/2008 [facility] Patient Information 2014 [place], [place].  Vitamin D Deficiency Vitamin D is an important vitamin that your body needs. Having too little of it in your body is called a deficiency. A very bad deficiency can make your bones soft and can cause a condition called rickets.  Vitamin D is important to your body for different reasons, such as:   It helps your body absorb 2 minerals called calcium and phosphorus.  It helps make your bones healthy.  It may prevent some diseases, such as diabetes and multiple sclerosis.  It helps your muscles and heart. You can get vitamin D in several ways. It is a natural part of some foods. The vitamin is also added to some dairy products and cereals. Some people take vitamin D supplements. Also, your body  makes vitamin D when you are in the sun. It changes the sun's rays into a form of the vitamin that your body can use. CAUSES   Not eating enough foods that contain vitamin D.  Not getting enough sunlight.  Having certain digestive system diseases that make it hard to absorb vitamin D. These diseases include Crohn's disease, chronic pancreatitis, and cystic  fibrosis.  Having a surgery in which part of the stomach or small intestine is removed.  Being obese. Fat cells pull vitamin D out of your blood. That means that obese people may not have enough vitamin D left in their blood and in other body tissues.  Having chronic kidney or liver disease. RISK FACTORS Risk factors are things that make you more likely to develop a vitamin D deficiency. They include:  Being older.  Not being able to get outside very much.  Living in a nursing home.  Having had broken bones.  Having weak or thin bones (osteoporosis).  Having a disease or condition that changes how your body absorbs vitamin D.  Having dark skin.  Some medicines such as seizure medicines or steroids.  Being overweight or obese. SYMPTOMS Mild cases of vitamin D deficiency may not have any symptoms. If you have a very bad case, symptoms may include:  Bone pain.  Muscle pain.  Falling often.  Broken bones caused by a minor injury, due to osteoporosis. DIAGNOSIS A blood test is the best way to tell if you have a vitamin D deficiency. TREATMENT Vitamin D deficiency can be treated in different ways. Treatment for vitamin D deficiency depends on what is causing it. Options include:  Taking vitamin D supplements.  Taking a calcium supplement. Your caregiver will suggest what dose is best for you. HOME CARE INSTRUCTIONS  Take any supplements that your caregiver prescribes. Follow the directions carefully. Take only the suggested amount.  Have your blood tested 2 months after you start taking supplements.  Eat foods that contain vitamin D. Healthy choices include:  Fortified dairy products, cereals, or juices. Fortified means vitamin D has been added to the food. Check the label on the package to be sure.  Fatty fish like salmon or trout.  Eggs.  Oysters.  Do not use a tanning bed.  Keep your weight at a healthy level. Lose weight if you need to.  Keep all  follow-up appointments. Your caregiver will need to perform blood tests to make sure your vitamin D deficiency is going away. SEEK MEDICAL CARE IF:  You have any questions about your treatment.  You continue to have symptoms of vitamin D deficiency.  You have nausea or vomiting.  You are constipated.  You feel confused.  You have severe abdominal or back pain. MAKE SURE YOU:  Understand these instructions.  Will watch your condition.  Will get help right away if you are not doing well or get worse. Document Released: 06/19/2011 Document Revised: 07/22/2012 Document Reviewed: 06/19/2011 [facility] Patient Information 2014 [place], [place].

## 2013-04-23 LAB — HEMOGLOBIN A1C
HEMOGLOBIN A1C: 5.5 % (ref <5.7)
Mean Plasma Glucose: 111 mg/dL (ref <117)

## 2013-04-23 LAB — TSH: TSH: 1.085 u[IU]/mL (ref 0.350–4.500)

## 2013-04-23 LAB — BASIC METABOLIC PANEL
BUN: 25 mg/dL — AB (ref 6–23)
CALCIUM: 9.3 mg/dL (ref 8.4–10.5)
CHLORIDE: 101 meq/L (ref 96–112)
CO2: 19 mEq/L (ref 19–32)
Creat: 1.36 mg/dL — ABNORMAL HIGH (ref 0.50–1.35)
Glucose, Bld: 73 mg/dL (ref 70–99)
Potassium: 4.6 mEq/L (ref 3.5–5.3)
Sodium: 133 mEq/L — ABNORMAL LOW (ref 135–145)

## 2013-04-23 LAB — HEPATIC FUNCTION PANEL
ALBUMIN: 4.5 g/dL (ref 3.5–5.2)
ALK PHOS: 37 U/L — AB (ref 39–117)
ALT: 66 U/L — ABNORMAL HIGH (ref 0–53)
AST: 54 U/L — ABNORMAL HIGH (ref 0–37)
BILIRUBIN TOTAL: 0.6 mg/dL (ref 0.3–1.2)
Bilirubin, Direct: 0.1 mg/dL (ref 0.0–0.3)
Indirect Bilirubin: 0.5 mg/dL (ref 0.0–0.9)
TOTAL PROTEIN: 7.5 g/dL (ref 6.0–8.3)

## 2013-04-23 LAB — VITAMIN D 25 HYDROXY (VIT D DEFICIENCY, FRACTURES): VIT D 25 HYDROXY: 61 ng/mL (ref 30–89)

## 2013-04-23 LAB — VITAMIN B12: Vitamin B-12: 1361 pg/mL — ABNORMAL HIGH (ref 211–911)

## 2013-04-23 LAB — TESTOSTERONE: Testosterone: 1795.74 ng/dL — ABNORMAL HIGH (ref 300–890)

## 2013-04-24 ENCOUNTER — Telehealth: Payer: Self-pay | Admitting: *Deleted

## 2013-04-25 ENCOUNTER — Telehealth: Payer: Self-pay

## 2013-04-25 NOTE — Telephone Encounter (Signed)
Message copied by Joya MartyrZMENT, Teckla Christiansen M on Fri Apr 25, 2013 10:37 AM ------      Message from: Lucky CowboyMCKEOWN, WILLIAM      Created: Thu Apr 24, 2013  2:48 PM       BMET and A1c nl/ok ------

## 2013-04-25 NOTE — Telephone Encounter (Signed)
lmom to pt about lab results. 

## 2013-04-28 NOTE — Telephone Encounter (Signed)
Patient aware of labs.  

## 2013-05-14 ENCOUNTER — Other Ambulatory Visit: Payer: Self-pay | Admitting: Emergency Medicine

## 2013-05-15 ENCOUNTER — Other Ambulatory Visit: Payer: Self-pay | Admitting: Internal Medicine

## 2013-05-15 DIAGNOSIS — F988 Other specified behavioral and emotional disorders with onset usually occurring in childhood and adolescence: Secondary | ICD-10-CM

## 2013-05-16 ENCOUNTER — Other Ambulatory Visit: Payer: Self-pay | Admitting: Emergency Medicine

## 2013-05-16 ENCOUNTER — Encounter: Payer: Self-pay | Admitting: Emergency Medicine

## 2013-05-16 MED ORDER — AMPHETAMINE-DEXTROAMPHETAMINE 20 MG PO TABS: 20.0000 mg | ORAL_TABLET | Freq: Two times a day (BID) | ORAL | Status: DC

## 2013-06-05 ENCOUNTER — Ambulatory Visit: Payer: Self-pay | Admitting: Internal Medicine

## 2013-06-10 ENCOUNTER — Other Ambulatory Visit: Payer: Self-pay | Admitting: Internal Medicine

## 2013-06-10 DIAGNOSIS — F988 Other specified behavioral and emotional disorders with onset usually occurring in childhood and adolescence: Secondary | ICD-10-CM

## 2013-06-10 MED ORDER — AMPHETAMINE-DEXTROAMPHETAMINE 20 MG PO TABS: ORAL_TABLET | ORAL | Status: DC

## 2013-06-16 ENCOUNTER — Encounter: Payer: Self-pay | Admitting: Internal Medicine

## 2013-06-16 NOTE — Progress Notes (Signed)
Patient ID: Darren Freeman, male   DOB: April 28, 1980, 33 y.o.   MRN: [id number] Error

## 2013-06-16 NOTE — Progress Notes (Deleted)
   Subjective:    Patient ID: Darren Freeman, male    DOB: 1980/06/10, 33 y.o.   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 In Jan 2014, patient was started on Citalopram.     Review of Systems     Objective:   Physical Exam        Assessment & Plan:

## 2013-07-07 ENCOUNTER — Other Ambulatory Visit: Payer: Self-pay | Admitting: Internal Medicine

## 2013-07-07 DIAGNOSIS — F988 Other specified behavioral and emotional disorders with onset usually occurring in childhood and adolescence: Secondary | ICD-10-CM

## 2013-07-07 MED ORDER — AMPHETAMINE-DEXTROAMPHETAMINE 20 MG PO TABS: ORAL_TABLET | ORAL | Status: DC

## 2013-07-29 ENCOUNTER — Encounter: Payer: Self-pay | Admitting: Internal Medicine

## 2013-07-29 ENCOUNTER — Ambulatory Visit: Payer: Self-pay | Admitting: Internal Medicine

## 2013-07-29 DIAGNOSIS — I10 Essential (primary) hypertension: Secondary | ICD-10-CM

## 2013-07-30 ENCOUNTER — Ambulatory Visit (INDEPENDENT_AMBULATORY_CARE_PROVIDER_SITE_OTHER): Payer: BC Managed Care – PPO | Admitting: Emergency Medicine

## 2013-07-30 ENCOUNTER — Encounter: Payer: Self-pay | Admitting: Emergency Medicine

## 2013-07-30 VITALS — BP 124/80 | HR 76 | Temp 98.6°F | Resp 18 | Ht 70.0 in | Wt 195.0 lb

## 2013-07-30 DIAGNOSIS — J329 Chronic sinusitis, unspecified: Secondary | ICD-10-CM

## 2013-07-30 DIAGNOSIS — J309 Allergic rhinitis, unspecified: Secondary | ICD-10-CM

## 2013-07-30 DIAGNOSIS — J40 Bronchitis, not specified as acute or chronic: Secondary | ICD-10-CM

## 2013-07-30 MED ORDER — ALBUTEROL SULFATE HFA 108 (90 BASE) MCG/ACT IN AERS: 2.0000 | INHALATION_SPRAY | Freq: Four times a day (QID) | RESPIRATORY_TRACT | Status: DC | PRN

## 2013-07-30 MED ORDER — BENZONATATE 100 MG PO CAPS: 100.0000 mg | ORAL_CAPSULE | Freq: Three times a day (TID) | ORAL | Status: DC | PRN

## 2013-07-30 MED ORDER — AMOXICILLIN 500 MG PO CAPS: 500.0000 mg | ORAL_CAPSULE | Freq: Three times a day (TID) | ORAL | Status: DC

## 2013-07-30 MED ORDER — IPRATROPIUM-ALBUTEROL 0.5-2.5 (3) MG/3ML IN SOLN
3.0000 mL | Freq: Once | RESPIRATORY_TRACT | Status: AC
  Administered 2013-07-30: 3 mL via RESPIRATORY_TRACT

## 2013-07-30 NOTE — Progress Notes (Signed)
Subjective:    Patient ID: Darren Freeman, male    DOB: 07/18/1980, 33 y.o.   MRN: [id number]  HPI Comments: 4 days of increasing cold symptoms. He has sinus pressure and cough. He notes lungs are burning. He has increased H20 intake. No relief dayquil/ nyquil/ mucinex. He notes green mucus started yesterday.   Patient had complete cardio workup late last year and denies any problems/ changes since workup.  Cough Associated symptoms include a fever.  Hyperlipidemia      Medication List       This list is accurate as of: 07/30/13 11:21 AM.  Always use your most recent med list.               ALIGN 4 MG Caps  Take 1 capsule by mouth daily.     ALPHA LIPOIC ACID PO  Take by mouth 2 (two) times daily.     amphetamine-dextroamphetamine 20 MG tablet  Commonly known as:  ADDERALL  1/2 to 1 tablet bid for ADD (Maximum 2 tabs/day)     citalopram 40 MG tablet  Commonly known as:  CELEXA  Take 1 tablet (40 mg total) by mouth daily.     FISH OIL + D3 1200-1000 MG-UNIT Caps  Take by mouth 4 (four) times daily.     fluticasone 50 MCG/ACT nasal spray  Commonly known as:  FLONASE     multivitamin tablet  Take 1 tablet by mouth daily.     testosterone cypionate 200 MG/ML injection  Commonly known as:  DEPOTESTOTERONE CYPIONATE     TESTOSTERONE IM  Inject into the muscle. 200mg  1.5cc q 3 weeks     trazodone 300 MG tablet  Commonly known as:  DESYREL  Take 150 mg by mouth at bedtime.     vitamin C 1000 MG tablet  Take 1,000 mg by mouth daily. 1-2 qd     VITAMIN D PO  Take 2,000 Int'l Units by mouth daily.       No Known Allergies  Past Medical History  Diagnosis Date  . Hypogonadism male   . HTN (hypertension)   . Anxiety   . Elevated LFTs   . Glucose intolerance (impaired glucose tolerance)     previously on metformin  . Vitamin D deficiency   . ADD (attention deficit disorder)    Review of Systems  Constitutional: Positive for fever and fatigue.   HENT: Positive for congestion and sinus pressure.   Respiratory: Positive for cough.   All other systems reviewed and are negative.  BP 124/80  Pulse 76  Temp(Src) 98.6 F (37 C) (Temporal)  Resp 18  Ht 5\' 10"  (1.778 m)  Wt 195 lb (88.451 kg)  BMI 27.98 kg/m2  SpO2 98%     Objective:   Physical Exam  Nursing note and vitals reviewed. Constitutional: He is oriented to person, place, and time. He appears well-developed and well-nourished.  HENT:  Head: Normocephalic and atraumatic.  Right Ear: External ear normal.  Left Ear: External ear normal.  Nose: Nose normal.  Mouth/Throat: Oropharynx is clear and moist. No oropharyngeal exudate.  Yellow TMs bilateral   Eyes: Conjunctivae are normal.  Neck: Normal range of motion.  Cardiovascular: Normal rate, regular rhythm and intact distal pulses.   Murmur heard. 2/6  Pulmonary/Chest: Effort normal. He has wheezes.  Tight/ congested cough   Abdominal: Soft.  Musculoskeletal: Normal range of motion.  Lymphadenopathy:    He has no cervical adenopathy.  Neurological: He is alert  and oriented to person, place, and time.  Skin: Skin is warm and dry.  Psychiatric: He has a normal mood and affect. Judgment normal.          Assessment & Plan:  1. Bronchitis/ Sinusitis-Amox 500 mg, Tessalon Perles, Alb HFA all AD. Duoneb given in office. w/c if SX increase or ER. Push fluids  2. Allergic rhinitis- Allegra OTC, increase H2o, allergy hygiene explained.

## 2013-07-30 NOTE — Patient Instructions (Signed)
Sinusitis Sinusitis is redness, soreness, and swelling (inflammation) of the paranasal sinuses. Paranasal sinuses are air pockets within the bones of your face (beneath the eyes, the middle of the forehead, or above the eyes). In healthy paranasal sinuses, mucus is able to drain out, and air is able to circulate through them by way of your nose. However, when your paranasal sinuses are inflamed, mucus and air can become trapped. This can allow bacteria and other germs to grow and cause infection. Sinusitis can develop quickly and last only a short time (acute) or continue over a long period (chronic). Sinusitis that lasts for more than 12 weeks is considered chronic.  CAUSES  Causes of sinusitis include:  Allergies.  Structural abnormalities, such as displacement of the cartilage that separates your nostrils (deviated septum), which can decrease the air flow through your nose and sinuses and affect sinus drainage.  Functional abnormalities, such as when the small hairs (cilia) that line your sinuses and help remove mucus do not work properly or are not present. SYMPTOMS  Symptoms of acute and chronic sinusitis are the same. The primary symptoms are pain and pressure around the affected sinuses. Other symptoms include:  Upper toothache.  Earache.  Headache.  Bad breath.  Decreased sense of smell and taste.  A cough, which worsens when you are lying flat.  Fatigue.  Fever.  Thick drainage from your nose, which often is green and may contain pus (purulent).  Swelling and warmth over the affected sinuses. DIAGNOSIS  Your caregiver will perform a physical exam. During the exam, your caregiver may:  Look in your nose for signs of abnormal growths in your nostrils (nasal polyps).  Tap over the affected sinus to check for signs of infection.  View the inside of your sinuses (endoscopy) with a special imaging device with a light attached (endoscope), which is inserted into your  sinuses. If your caregiver suspects that you have chronic sinusitis, one or more of the following tests may be recommended:  Allergy tests.  Nasal culture A sample of mucus is taken from your nose and sent to a lab and screened for bacteria.  Nasal cytology A sample of mucus is taken from your nose and examined by your caregiver to determine if your sinusitis is related to an allergy. TREATMENT  Most cases of acute sinusitis are related to a viral infection and will resolve on their own within 10 days. Sometimes medicines are prescribed to help relieve symptoms (pain medicine, decongestants, nasal steroid sprays, or saline sprays).  However, for sinusitis related to a bacterial infection, your caregiver will prescribe antibiotic medicines. These are medicines that will help kill the bacteria causing the infection.  Rarely, sinusitis is caused by a fungal infection. In theses cases, your caregiver will prescribe antifungal medicine. For some cases of chronic sinusitis, surgery is needed. Generally, these are cases in which sinusitis recurs more than 3 times per year, despite other treatments. HOME CARE INSTRUCTIONS   Drink plenty of water. Water helps thin the mucus so your sinuses can drain more easily.  Use a humidifier.  Inhale steam 3 to 4 times a day (for example, sit in the bathroom with the shower running).  Apply a warm, moist washcloth to your face 3 to 4 times a day, or as directed by your caregiver.  Use saline nasal sprays to help moisten and clean your sinuses.  Take over-the-counter or prescription medicines for pain, discomfort, or fever only as directed by your caregiver. SEEK IMMEDIATE MEDICAL   CARE IF:  You have increasing pain or severe headaches.  You have nausea, vomiting, or drowsiness.  You have swelling around your face.  You have vision problems.  You have a stiff neck.  You have difficulty breathing. MAKE SURE YOU:   Understand these  instructions.  Will watch your condition.  Will get help right away if you are not doing well or get worse. Document Released: 03/27/2005 Document Revised: 06/19/2011 Document Reviewed: 04/11/2011 [facility] Patient Information 2014 [place], [place]. Allergic Rhinitis Allergic rhinitis is when the mucous membranes in the nose respond to allergens. Allergens are particles in the air that cause your body to have an allergic reaction. This causes you to release allergic antibodies. Through a chain of events, these eventually cause you to release histamine into the blood stream. Although meant to protect the body, it is this release of histamine that causes your discomfort, such as frequent sneezing, congestion, and an itchy, runny nose.  CAUSES  Seasonal allergic rhinitis (hay fever) is caused by pollen allergens that may come from grasses, trees, and weeds. Year-round allergic rhinitis (perennial allergic rhinitis) is caused by allergens such as house dust mites, pet dander, and mold spores.  SYMPTOMS   Nasal stuffiness (congestion).  Itchy, runny nose with sneezing and tearing of the eyes. DIAGNOSIS  Your health care provider can help you determine the allergen or allergens that trigger your symptoms. If you and your health care provider are unable to determine the allergen, skin or blood testing may be used. TREATMENT  Allergic Rhinitis does not have a cure, but it can be controlled by:  Medicines and allergy shots (immunotherapy).  Avoiding the allergen. Hay fever may often be treated with antihistamines in pill or nasal spray forms. Antihistamines block the effects of histamine. There are over-the-counter medicines that may help with nasal congestion and swelling around the eyes. Check with your health care provider before taking or giving this medicine.  If avoiding the allergen or the medicine prescribed do not work, there are many new medicines your health care provider can prescribe.  Stronger medicine may be used if initial measures are ineffective. Desensitizing injections can be used if medicine and avoidance does not work. Desensitization is when a patient is given ongoing shots until the body becomes less sensitive to the allergen. Make sure you follow up with your health care provider if problems continue. HOME CARE INSTRUCTIONS It is not possible to completely avoid allergens, but you can reduce your symptoms by taking steps to limit your exposure to them. It helps to know exactly what you are allergic to so that you can avoid your specific triggers. SEEK MEDICAL CARE IF:   You have a fever.  You develop a cough that does not stop easily (persistent).  You have shortness of breath.  You start wheezing.  Symptoms interfere with normal daily activities. Document Released: 12/20/2000 Document Revised: 01/15/2013 Document Reviewed: 12/02/2012 [facility] Patient Information 2014 [place]Exit[place], [place]. Bronchitis Bronchitis is swelling (inflammation) of the air tubes leading to your lungs (bronchi). This causes mucus and a cough. If the swelling gets bad, you may have trouble breathing. HOME CARE   Rest.  Drink enough fluids to keep your pee (urine) clear or pale yellow (unless you have a condition where you have to watch how much you drink).  Only take medicine as told by your doctor. If you were given antibiotic medicines, finish them even if you start to feel better.  Avoid smoke, irritating chemicals, and strong smells.  These make the problem worse. Quit smoking if you smoke. This helps your lungs heal faster.  Use a cool mist humidifier. Change the water in the humidifier every day. You can also sit in the bathroom with hot shower running for 5 10 minutes. Keep the door closed.  See your health care provider as told.  Wash your hands often. GET HELP IF: Your problems do not get better after 1 week. GET HELP RIGHT AWAY IF:   Your fever gets worse.  You have  chills.  Your chest hurts.  Your problems breathing get worse.  You have blood in your mucus.  You pass out (faint).  You feel lightheaded.  You have a bad headache.  You throw up (vomit) again and again. MAKE SURE YOU:  Understand these instructions.  Will watch your condition.  Will get help right away if you are not doing well or get worse. Document Released: 09/13/2007 Document Revised: 01/15/2013 Document Reviewed: 11/19/2012 [facility]Exit[facility] Patient Information 2014 [place]ExitCare, [place]LL[place].

## 2013-08-07 ENCOUNTER — Other Ambulatory Visit: Payer: Self-pay | Admitting: Internal Medicine

## 2013-08-07 DIAGNOSIS — F988 Other specified behavioral and emotional disorders with onset usually occurring in childhood and adolescence: Secondary | ICD-10-CM

## 2013-08-07 MED ORDER — AMPHETAMINE-DEXTROAMPHETAMINE 20 MG PO TABS: ORAL_TABLET | ORAL | Status: DC

## 2013-08-13 ENCOUNTER — Ambulatory Visit: Payer: Self-pay | Admitting: Internal Medicine

## 2013-08-18 ENCOUNTER — Encounter: Payer: Self-pay | Admitting: Internal Medicine

## 2013-08-18 DIAGNOSIS — E782 Mixed hyperlipidemia: Secondary | ICD-10-CM | POA: Insufficient documentation

## 2013-08-18 DIAGNOSIS — Z79899 Other long term (current) drug therapy: Secondary | ICD-10-CM | POA: Insufficient documentation

## 2013-08-18 DIAGNOSIS — R7309 Other abnormal glucose: Secondary | ICD-10-CM | POA: Insufficient documentation

## 2013-08-18 DIAGNOSIS — Z0000 Encounter for general adult medical examination without abnormal findings: Secondary | ICD-10-CM

## 2013-08-18 DIAGNOSIS — I10 Essential (primary) hypertension: Secondary | ICD-10-CM | POA: Insufficient documentation

## 2013-08-18 NOTE — Progress Notes (Signed)
Patient ID: Darren Freeman, male   DOB: [birth year], 33 y.o.   MRN: [id number]          Lenard GallowayN O   S H O W  #  2

## 2013-09-16 ENCOUNTER — Other Ambulatory Visit: Payer: Self-pay | Admitting: Internal Medicine

## 2014-01-07 ENCOUNTER — Telehealth: Payer: Self-pay | Admitting: Physician Assistant

## 2014-01-07 ENCOUNTER — Other Ambulatory Visit: Payer: Self-pay | Admitting: Emergency Medicine

## 2014-01-07 MED ORDER — AMPHETAMINE-DEXTROAMPHETAMINE 20 MG PO TABS: 20.0000 mg | ORAL_TABLET | Freq: Two times a day (BID) | ORAL | Status: DC

## 2014-01-07 MED ORDER — TRAZODONE HCL 150 MG PO TABS: ORAL_TABLET | ORAL | Status: DC

## 2014-01-07 NOTE — Telephone Encounter (Signed)
Patient has appointment 01/22/2014 with the office, he messaged and requested a refill of his Adderall due to increase stress at work. We had not filled this since June. This prescription was given to him for Adderall 20mg  #60. [facility] had received Adderall from Dr. Evelene CroonKaur a #90 on 12/29/2013. The pharmacist was instructed to destroy the prescription and we will not give the patient any controlled substances again.

## 2014-01-10 NOTE — Addendum Note (Signed)
Addended by: Lucky CowboyMCKEOWN, Tarisha Fader on: 01/10/2014 02:25 PM   Modules accepted: Orders

## 2014-01-22 ENCOUNTER — Ambulatory Visit (INDEPENDENT_AMBULATORY_CARE_PROVIDER_SITE_OTHER): Payer: BC Managed Care – PPO | Admitting: Internal Medicine

## 2014-01-22 DIAGNOSIS — Z9119 Patient's noncompliance with other medical treatment and regimen: Secondary | ICD-10-CM

## 2014-01-22 NOTE — Progress Notes (Signed)
Patient ID: Darren Freeman, male   DOB: [birth year], 33 y.o.   MRN: [id number]  N O N E    C O M P L I A N C E

## 2014-01-22 NOTE — Patient Instructions (Signed)
Recommend the book "The END of DIETING" by Dr Baker Janus   and the book "The END of DIABETES " by Dr Excell Seltzer  At [email or web address] - get book & Audio CD's      Being diabetic has a  300% increased risk for heart attack, stroke, cancer, and alzheimer- type vascular dementia. It is very important that you work harder with diet by avoiding all foods that are white except chicken & fish. Avoid white rice (brown & wild rice is OK), white potatoes (sweetpotatoes in moderation is OK), White bread or wheat bread or anything made out of white flour like bagels, donuts, rolls, buns, biscuits, cakes, pastries, cookies, pizza crust, and pasta (made from white flour & egg whites) - vegetarian pasta or spinach or wheat pasta is OK. Multigrain breads like Arnold's or [employer], or multigrain sandwich thins or flatbreads.  Diet, exercise and weight loss can reverse and cure diabetes in the early stages.  Diet, exercise and weight loss is very important in the control and prevention of complications of diabetes which affects every system in your body, ie. Brain - dementia/stroke, eyes - glaucoma/blindness, heart - heart attack/heart failure, kidneys - dialysis, stomach - gastric paralysis, intestines - malabsorption, nerves - severe painful neuritis, circulation - gangrene & loss of a leg(s), and finally cancer and Alzheimers.    I recommend avoid fried & greasy foods,  sweets/candy, white rice (brown or wild rice or Quinoa is OK), white potatoes (sweet potatoes are OK) - anything made from white flour - bagels, doughnuts, rolls, buns, biscuits,white and wheat breads, pizza crust and traditional pasta made of white flour & egg white(vegetarian pasta or spinach or wheat pasta is OK).  Multi-grain bread is OK - like multi-grain flat bread or sandwich thins. Avoid alcohol in excess. Exercise is also important.    Eat all the vegetables you want - avoid meat, especially red meat and dairy - especially cheese.  Cheese  is the most concentrated form of trans-fats which is the worst thing to clog up our arteries. Veggie cheese is OK which can be found in the fresh produce section at [employer] or [employer] or [employer]  Preventive Care for Adults A healthy lifestyle and preventive care can promote health and wellness. Preventive health guidelines for men include the following key practices:  A routine yearly physical is a good way to check with your health care provider about your health and preventative screening. It is a chance to share any concerns and updates on your health and to receive a thorough exam.  Visit your dentist for a routine exam and preventative care every 6 months. Brush your teeth twice a day and floss once a day. Good oral hygiene prevents tooth decay and gum disease.  The frequency of eye exams is based on your age, health, family medical history, use of contact lenses, and other factors. Follow your health care provider's recommendations for frequency of eye exams.  Eat a healthy diet. Foods such as vegetables, fruits, whole grains, low-fat dairy products, and lean protein foods contain the nutrients you need without too many calories. Decrease your intake of foods high in solid fats, added sugars, and salt. Eat the right amount of calories for you.Get information about a proper diet from your health care provider, if necessary.  Regular physical exercise is one of the most important things you can do for your health. Most adults should get at least 150 minutes of moderate-intensity exercise (any activity that  increases your heart rate and causes you to sweat) each week. In addition, most adults need muscle-strengthening exercises on 2 or more days a week.  Maintain a healthy weight. The body mass index (BMI) is a screening tool to identify possible weight problems. It provides an estimate of body fat based on height and weight. Your health care provider can find your BMI and can help you  achieve or maintain a healthy weight.For adults 20 years and older:  A BMI below 18.5 is considered underweight.  A BMI of 18.5 to 24.9 is normal.  A BMI of 25 to 29.9 is considered overweight.  A BMI of 30 and above is considered obese.  Maintain normal blood lipids and cholesterol levels by exercising and minimizing your intake of saturated fat. Eat a balanced diet with plenty of fruit and vegetables. Blood tests for lipids and cholesterol should begin at age 20 and be repeated every 5 years. If your lipid or cholesterol levels are high, you are over 50, or you are at high risk for heart disease, you may need your cholesterol levels checked more frequently.Ongoing high lipid and cholesterol levels should be treated with medicines if diet and exercise are not working.  If you smoke, find out from your health care provider how to quit. If you do not use tobacco, do not start.  Lung cancer screening is recommended for adults aged 55-80 years who are at high risk for developing lung cancer because of a history of smoking. A yearly low-dose CT scan of the lungs is recommended for people who have at least a 30-pack-year history of smoking and are a current smoker or have quit within the past 15 years. A pack year of smoking is smoking an average of 1 pack of cigarettes a day for 1 year (for example: 1 pack a day for 30 years or 2 packs a day for 15 years). Yearly screening should continue until the smoker has stopped smoking for at least 15 years. Yearly screening should be stopped for people who develop a health problem that would prevent them from having lung cancer treatment.  If you choose to drink alcohol, do not have more than 2 drinks per day. One drink is considered to be 12 ounces (355 mL) of beer, 5 ounces (148 mL) of wine, or 1.5 ounces (44 mL) of liquor.  High blood pressure causes heart disease and increases the risk of stroke. Your blood pressure should be checked. Ongoing high blood  pressure should be treated with medicines, if weight loss and exercise are not effective.  If you are 45-79 years old, ask your health care provider if you should take aspirin to prevent heart disease.  Diabetes screening involves taking a blood sample to check your fasting blood sugar level. Testing should be considered at a younger age or be carried out more frequently if you are overweight and have at least 1 risk factor for diabetes.  Colorectal cancer can be detected and often prevented. Most routine colorectal cancer screening begins at the age of 50 and continues through age 75. However, your health care provider may recommend screening at an earlier age if you have risk factors for colon cancer. On a yearly basis, your health care provider may provide home test kits to check for hidden blood in the stool. Use of a small camera at the end of a tube to directly examine the colon (sigmoidoscopy or colonoscopy) can detect the earliest forms of colorectal cancer. Talk to your   health care provider about this at age 35, when routine screening begins. Direct exam of the colon should be repeated every 5-10 years through age 23, unless early forms of precancerous polyps or small growths are found.  Screening for abdominal aortic aneurysm (AAA)  are recommended for persons over age 77 who have history of hypertensionor who are current or former smokers.  Talk with your health care provider about prostate cancer screening.  Testicular cancer screening is recommended for adult males. Screening includes self-exam, a health care provider exam, and other screening tests. Consult with your health care provider about any symptoms you have or any concerns you have about testicular cancer.  Use sunscreen. Apply sunscreen liberally and repeatedly throughout the day. You should seek shade when your shadow is shorter than you. Protect yourself by wearing long sleeves, pants, a wide-brimmed hat, and sunglasses year  round, whenever you are outdoors.  Once a month, do a whole-body skin exam, using a mirror to look at the skin on your back. Tell your health care provider about new moles, moles that have irregular borders, moles that are larger than a pencil eraser, or moles that have changed in shape or color.  Stay current with required vaccines (immunizations).  Influenza vaccine. All adults should be immunized every year.  Tetanus, diphtheria, and acellular pertussis (Td, Tdap) vaccine. An adult who has not previously received Tdap or who does not know his vaccine status should receive 1 dose of Tdap. This initial dose should be followed by tetanus and diphtheria toxoids (Td) booster doses every 10 years. Adults with an unknown or incomplete history of completing a 3-dose immunization series with Td-containing vaccines should begin or complete a primary immunization series including a Tdap dose. Adults should receive a Td booster every 10 years.  Zoster vaccine. One dose is recommended for adults aged 1 years or older unless certain conditions are present.    Pneumococcal 13-valent conjugate (PCV13) vaccine. When indicated, a person who is uncertain of his immunization history and has no record of immunization should receive the PCV13 vaccine. An adult aged 30 years or older who has certain medical conditions and has not been previously immunized should receive 1 dose of PCV13 vaccine. This PCV13 should be followed with a dose of pneumococcal polysaccharide (PPSV23) vaccine. The PPSV23 vaccine dose should be obtained at least 8 weeks after the dose of PCV13 vaccine. An adult aged 19 years or older who has certain medical conditions and previously received 1 or more doses of PPSV23 vaccine should receive 1 dose of PCV13. The PCV13 vaccine dose should be obtained 1 or more years after the last PPSV23 vaccine dose.    Pneumococcal polysaccharide (PPSV23) vaccine. When PCV13 is also indicated, PCV13 should be  obtained first. All adults aged 59 years and older should be immunized. An adult younger than age 7 years who has certain medical conditions should be immunized. Any person who resides in a nursing home or long-term care facility should be immunized. An adult smoker should be immunized. People with an immunocompromised condition and certain other conditions should receive both PCV13 and PPSV23 vaccines. People with human immunodeficiency virus (HIV) infection should be immunized as soon as possible after diagnosis. Immunization during chemotherapy or radiation therapy should be avoided. Routine use of PPSV23 vaccine is not recommended for American Indians, [place], or people younger than 65 years unless there are medical conditions that require PPSV23 vaccine. When indicated, people who have unknown immunization and have no record  of immunization should receive PPSV23 vaccine. One-time revaccination 5 years after the first dose of PPSV23 is recommended for people aged 19-64 years who have chronic kidney failure, nephrotic syndrome, asplenia, or immunocompromised conditions. People who received 1-2 doses of PPSV23 before age 70 years should receive another dose of PPSV23 vaccine at age 93 years or later if at least 5 years have passed since the previous dose. Doses of PPSV23 are not needed for people immunized with PPSV23 at or after age 27 years.  Hepatitis A vaccine. Adults who wish to be protected from this disease, have certain high-risk conditions, work with hepatitis A-infected animals, work in hepatitis A research labs, or travel to or work in countries with a high rate of hepatitis A should be immunized. Adults who were previously unvaccinated and who anticipate close contact with an international adoptee during the first 60 days after arrival in the [place] from a country with a high rate of hepatitis A should be immunized.  Hepatitis B vaccine. Adults should be immunized if they wish to be  protected from this disease, have certain high-risk conditions, may be exposed to blood or other infectious body fluids, are household contacts or sex partners of hepatitis B positive people, are clients or workers in certain care facilities, or travel to or work in countries with a high rate of hepatitis B.  Preventive Service / Frequency  Ages 52 to 61  Blood pressure check.** / Every 1 to 2 years.  Lipid and cholesterol check.** / Every 5 years beginning at age 18.  Hepatitis C blood test.** / For any individual with known risks for hepatitis C.  Skin self-exam. / Monthly.  Influenza vaccine. / Every year.  Tetanus, diphtheria, and acellular pertussis (Tdap, Td) vaccine.** / Consult your health care provider. 1 dose of Td every 10 years.  Varicella vaccine.** / Consult your health care provider.  HPV vaccine. / 3 doses over 6 months, if 51 or younger.  Measles, mumps, rubella (MMR) vaccine.** / You need at least 1 dose of MMR if you were born in 1957 or later. You may also need a second dose.  Pneumococcal 13-valent conjugate (PCV13) vaccine.** / Consult your health care provider.  Pneumococcal polysaccharide (PPSV23) vaccine.** / 1 to 2 doses if you smoke cigarettes or if you have certain conditions.  Meningococcal vaccine.** / 1 dose if you are age 30 to 42 years and a Market researcher living in a residence hall, or have one of several medical conditions. You may also need additional booster doses.  Hepatitis A vaccine.** / Consult your health care provider.  Hepatitis B vaccine.** / Consult your health care provider.

## 2014-01-23 DIAGNOSIS — Z9119 Patient's noncompliance with other medical treatment and regimen: Secondary | ICD-10-CM | POA: Insufficient documentation

## 2014-01-23 DIAGNOSIS — Z91199 Patient's noncompliance with other medical treatment and regimen due to unspecified reason: Secondary | ICD-10-CM | POA: Insufficient documentation

## 2015-01-29 ENCOUNTER — Encounter: Payer: Self-pay | Admitting: Internal Medicine

## 2015-12-23 ENCOUNTER — Encounter (HOSPITAL_COMMUNITY): Payer: Self-pay | Admitting: Emergency Medicine

## 2015-12-23 ENCOUNTER — Emergency Department (HOSPITAL_COMMUNITY)
Admission: EM | Admit: 2015-12-23 | Discharge: 2015-12-23 | Discharge status: Against Medical Advice | Payer: 59 | Attending: Emergency Medicine | Admitting: Emergency Medicine

## 2015-12-23 ENCOUNTER — Emergency Department (HOSPITAL_COMMUNITY): Payer: 59

## 2015-12-23 DIAGNOSIS — F909 Attention-deficit hyperactivity disorder, unspecified type: Secondary | ICD-10-CM | POA: Diagnosis not present

## 2015-12-23 DIAGNOSIS — R51 Headache: Secondary | ICD-10-CM | POA: Insufficient documentation

## 2015-12-23 DIAGNOSIS — K5900 Constipation, unspecified: Secondary | ICD-10-CM | POA: Insufficient documentation

## 2015-12-23 DIAGNOSIS — Z87891 Personal history of nicotine dependence: Secondary | ICD-10-CM | POA: Insufficient documentation

## 2015-12-23 DIAGNOSIS — R066 Hiccough: Secondary | ICD-10-CM | POA: Diagnosis not present

## 2015-12-23 DIAGNOSIS — Z79899 Other long term (current) drug therapy: Secondary | ICD-10-CM | POA: Insufficient documentation

## 2015-12-23 DIAGNOSIS — I10 Essential (primary) hypertension: Secondary | ICD-10-CM | POA: Diagnosis not present

## 2015-12-23 DIAGNOSIS — R1110 Vomiting, unspecified: Secondary | ICD-10-CM | POA: Insufficient documentation

## 2015-12-23 DIAGNOSIS — M62838 Other muscle spasm: Secondary | ICD-10-CM | POA: Insufficient documentation

## 2015-12-23 DIAGNOSIS — R0602 Shortness of breath: Secondary | ICD-10-CM | POA: Diagnosis not present

## 2015-12-23 LAB — CBC
HEMATOCRIT: 43.7 % (ref 39.0–52.0)
Hemoglobin: 14.7 g/dL (ref 13.0–17.0)
MCH: 29.9 pg (ref 26.0–34.0)
MCHC: 33.6 g/dL (ref 30.0–36.0)
MCV: 88.8 fL (ref 78.0–100.0)
Platelets: 272 10*3/uL (ref 150–400)
RBC: 4.92 MIL/uL (ref 4.22–5.81)
RDW: 14.9 % (ref 11.5–15.5)
WBC: 13.2 10*3/uL — AB (ref 4.0–10.5)

## 2015-12-23 LAB — COMPREHENSIVE METABOLIC PANEL
ALT: 67 U/L — ABNORMAL HIGH (ref 17–63)
ANION GAP: 10 (ref 5–15)
AST: 58 U/L — AB (ref 15–41)
Albumin: 3.9 g/dL (ref 3.5–5.0)
Alkaline Phosphatase: 40 U/L (ref 38–126)
BILIRUBIN TOTAL: 0.8 mg/dL (ref 0.3–1.2)
BUN: 35 mg/dL — AB (ref 6–20)
CO2: 26 mmol/L (ref 22–32)
Calcium: 9.3 mg/dL (ref 8.9–10.3)
Chloride: 99 mmol/L — ABNORMAL LOW (ref 101–111)
Creatinine, Ser: 1.5 mg/dL — ABNORMAL HIGH (ref 0.61–1.24)
GFR calc Af Amer: >60 mL/min (ref >60)
GFR, EST NON AFRICAN AMERICAN: 59 mL/min — AB (ref >60)
Glucose, Bld: 90 mg/dL (ref 65–99)
POTASSIUM: 3.9 mmol/L (ref 3.5–5.1)
Sodium: 135 mmol/L (ref 135–145)
TOTAL PROTEIN: 8 g/dL (ref 6.5–8.1)

## 2015-12-23 LAB — LIPASE, BLOOD: Lipase: 32 U/L (ref 11–51)

## 2015-12-23 MED ORDER — IOPAMIDOL (ISOVUE-300) INJECTION 61%
100.0000 mL | Freq: Once | INTRAVENOUS | Status: AC | PRN
  Administered 2015-12-23: 100 mL via INTRAVENOUS

## 2015-12-23 MED ORDER — METOCLOPRAMIDE HCL 5 MG/ML IJ SOLN
10.0000 mg | Freq: Once | INTRAMUSCULAR | Status: AC
  Administered 2015-12-23: 10 mg via INTRAVENOUS
  Filled 2015-12-23: qty 2

## 2015-12-23 NOTE — ED Provider Notes (Signed)
[facility]-EMERGENCY DEPT Provider Note   CSN: [id number] Arrival date & time: 12/23/15  1737  By signing my name below, I, Alyssa GroveMartin Green, attest that this documentation has been prepared under the direction and in the presence of WordenSerena Felisia Balcom, [place]. Electronically Signed: Alyssa GroveMartin Green, ED Scribe. 12/23/15. 9:37 PM.   History   Chief Complaint Chief Complaint  Patient presents with  . Hiccups  . abd swelling   The history is provided by the patient. No language interpreter was used.   HPI Comments: Darren Freeman is a 35 y.o. male with PMHx of HTN and HLD who presents to the Emergency Department complaining of chronic episodic hiccups onset 6 days PTA. He states he has experienced episodes of hiccupping before, but that those episodes usually last only 3 days. Pt states he smokes marijuana every now and then and that seemed to ease the hiccup frequency temporarily. He reports associated abdominal swelling, chest spasms, headaches,  constipation, decreased appetite, sleep disturbance and emesis. States his abdomen feels swollen. He is starting to feel nauseated and anytime he eats he feels like it is coming up. Pt states his last normal bowel movement was 1 week ago. He states he was able to move his bowels earlier today, but it required increased effort. He believes that the episodes are triggered by his Xanax that he takes as needed, which is usually once a month. Pt denies nausea, dysuria, difficulty urinating.   Past Medical History:  Diagnosis Date  . ADD (attention deficit disorder)   . Anxiety   . Elevated LFTs   . Glucose intolerance (impaired glucose tolerance)    previously on metformin  . HTN (hypertension)   . Hypogonadism male   . Vitamin D deficiency     Patient Active Problem List   Diagnosis Date Noted  . Non compliance with medical treatment 01/23/2014  . Hyperlipidemia 08/18/2013  . Encounter for long-term (current) use of other medications 08/18/2013  . Essential  hypertension 08/18/2013  . Abnormal glucose 08/18/2013  . Other malaise and fatigue 04/22/2013  . Vitamin D deficiency   . ADD (attention deficit disorder)   . Testosterone Deficiency 09/06/2011  . Anxiety 09/06/2011    Past Surgical History:  Procedure Laterality Date  . neg hx         Home Medications    Prior to Admission medications   Medication Sig Start Date End Date Taking? Authorizing Provider  albuterol (PROVENTIL HFA;VENTOLIN HFA) 108 (90 BASE) MCG/ACT inhaler Inhale 2 puffs into the lungs every 6 (six) hours as needed for wheezing or shortness of breath. 07/30/13   Loree FeeMelissa Smith, PA-C  ALPHA LIPOIC ACID PO Take by mouth 2 (two) times daily.     Historical Provider, MD  Ascorbic Acid (VITAMIN C) 1000 MG tablet Take 1,000 mg by mouth daily. 1-2 qd    Historical Provider, MD  Cholecalciferol (VITAMIN D PO) Take 2,000 Int'l Units by mouth daily.    Historical Provider, MD  citalopram (CELEXA) 40 MG tablet TAKE 1 TABLET BY MOUTH EVERY DAY 08/07/13   Quentin MullingAmanda Collier, PA-C  Fish Oil-Cholecalciferol (FISH OIL + D3) 1200-1000 MG-UNIT CAPS Take by mouth 4 (four) times daily.     Historical Provider, MD  fluticasone Aleda Grana(FLONASE) 50 MCG/ACT nasal spray  04/03/13   Historical Provider, MD  Multiple Vitamin (MULTIVITAMIN) tablet Take 1 tablet by mouth daily.    Historical Provider, MD  Probiotic Product (ALIGN) 4 MG CAPS Take 1 capsule by mouth daily. 09/05/11   Vonna KotykJay  Nyra Capes, MD  testosterone cypionate (DEPOTESTOTERONE CYPIONATE) 200 MG/ML injection  02/25/13   Historical Provider, MD  traZODone (DESYREL) 150 MG tablet TAKE 1/3 TO 1/2 TO 1 TABLET BY MOUTH EVERY NIGHT AT BEDTIME AS NEEDED SLEEP 01/07/14   Quentin Mulling, PA-C  trazodone (DESYREL) 300 MG tablet Take 150 mg by mouth at bedtime.    Historical Provider, MD    Family History Family History  Problem Relation Age of Onset  . Colon cancer Neg Hx   . Breast cancer Paternal Grandmother     Social History Social History    Substance Use Topics  . Smoking status: Former Smoker    Quit date: 07/30/2001  . Smokeless tobacco: Never Used  . Alcohol use No     Comment: previously used cocaine (quit x 2 years)     Allergies   Review of patient's allergies indicates no known allergies.   Review of Systems Review of Systems  Constitutional: Positive for appetite change. Negative for fever.  Respiratory: Positive for shortness of breath.        + Hiccups  Gastrointestinal: Positive for abdominal distention, constipation and vomiting. Negative for nausea.  Genitourinary: Negative for difficulty urinating and dysuria.  Musculoskeletal:       + Chest spasms  Neurological: Positive for headaches.  Psychiatric/Behavioral: Positive for sleep disturbance.  All other systems reviewed and are negative.  Physical Exam Updated Vital Signs BP 144/78 (BP Location: Left Arm)   Pulse 72   Temp 98.8 F (37.1 C) (Oral)   Resp 20   SpO2 100%   Physical Exam  Constitutional: He is active. No distress.  HENT:  Head: Normocephalic and atraumatic.  Eyes: Conjunctivae are normal.  Neck: Neck supple.  Cardiovascular: Normal rate, regular rhythm and normal heart sounds.   Pulmonary/Chest: Effort normal and breath sounds normal. No respiratory distress.  Abdominal: He exhibits distension. There is no tenderness. There is no guarding.  Abdomen distended mildly taut no tenderness no rebound no guarding No CVA tenderness  Musculoskeletal: Normal range of motion.  Neurological: He is alert.  Skin: Skin is warm and dry.  Nursing note and vitals reviewed.   ED Treatments / Results  DIAGNOSTIC STUDIES: Oxygen Saturation is 100% on RA, normal by my interpretation.    COORDINATION OF CARE: 9:33 PM Discussed treatment plan with pt at bedside which includes Urinalysis and CT Abdomen Pelvis and pt agreed to plan.  Labs (all labs ordered are listed, but only abnormal results are displayed) Labs Reviewed  COMPREHENSIVE  METABOLIC PANEL - Abnormal; Notable for the following:       Result Value   Chloride 99 (*)    BUN 35 (*)    Creatinine, Ser 1.50 (*)    AST 58 (*)    ALT 67 (*)    GFR calc non Af Amer 59 (*)    All other components within normal limits  CBC - Abnormal; Notable for the following:    WBC 13.2 (*)    All other components within normal limits  LIPASE, BLOOD    EKG  EKG Interpretation None       Radiology Ct Abdomen Pelvis W Contrast  Result Date: 12/23/2015 CLINICAL DATA:  Epigastric pain, hit Cox, constipation EXAM: CT ABDOMEN AND PELVIS WITH CONTRAST TECHNIQUE: Multidetector CT imaging of the abdomen and pelvis was performed using the standard protocol following bolus administration of intravenous contrast. CONTRAST:  ISOVUE-300 IOPAMIDOL (ISOVUE-300) INJECTION 61% COMPARISON:  CT abdomen pelvis 02/07/2010 FINDINGS:  Lower chest: No pulmonary nodules. No visible pleural or pericardial effusion. Hepatobiliary: Small hypodensity in the right hepatic lobe, favored to be hepatic cyst or biliary hamartomas. No other focal liver lesion. No ascites. No biliary dilatation. Normal gallbladder. Pancreas: Normal pancreatic contours and enhancement. No peripancreatic fluid collection or pancreatic ductal dilatation. Spleen: Normal. Adrenals/Urinary Tract: Normal adrenal glands. No hydronephrosis or solid renal mass. Stomach/Bowel: No abnormal bowel dilatation. No bowel wall thickening or adjacent fat stranding to indicate acute inflammation. No abdominal fluid collection. Normal appendix. Vascular/Lymphatic: Normal course and caliber of the major abdominal vessels. No abdominal or pelvic adenopathy. Reproductive: Normal prostate and seminal vesicles. Musculoskeletal: No lytic or blastic osseous lesion. Normal visualized extrathoracic and extraperitoneal soft tissues. Other: No contributory non-categorized findings. IMPRESSION: No acute abnormality of the abdomen or pelvis. Electronically Signed    By: Deatra Robinson M.D.   On: 12/23/2015 23:15    Procedures Procedures (including critical care time)  Medications Ordered in ED Medications - No data to display   Initial Impression / Assessment and Plan / ED Course  I have reviewed the triage vital signs and the nursing notes.  Pertinent labs & imaging results that were available during my care of the patient were reviewed by me and considered in my medical decision making (see chart for details).  Clinical Course    LFT and kidney function baseline. WBC 13.2. More concerning than hiccups was pt's complaint of abdominal distension following constipation for one week, now unable to have a BM or pass gas. Starting to feel nauseated and at times throwing up with PO intake. Concern for possible bowel obstruction. Pt declined rectal exam. Apparently while waiting for CT abd/pelvis results pt decided he could not wait any longer and had to leave and left against nursing staff medical advice. I was not notified. However, on review of his scan no acute findings are noted.   Final Clinical Impressions(s) / ED Diagnoses   Final diagnoses:  Hiccups    New Prescriptions New Prescriptions   No medications on file   I personally performed the services described in this documentation, which was scribed in my presence. The recorded information has been reviewed and is accurate.    Carlene Coria, PA-C 12/24/15 [id number]    Arby Barrette, MD 12/24/15 [phone]

## 2015-12-23 NOTE — ED Triage Notes (Signed)
Pt has had hiccups for the past 6 days. Has these episodes occasionally, but normally only lasts 3 days. Pt also has abd swelling and emesis from hiccups

## 2015-12-23 NOTE — ED Notes (Signed)
Pt states that he cannot wait any longer due to childcare issues. Pt requesting to leave AMA.

## 2015-12-23 NOTE — ED Notes (Signed)
Pt transported to CT ?

## 2015-12-27 ENCOUNTER — Telehealth: Payer: Self-pay | Admitting: Internal Medicine

## 2015-12-27 NOTE — Telephone Encounter (Signed)
Discussed with pt that he has not been seen in the office since 2014 and that he should contact his PCP or urgent care until OV.

## 2015-12-28 ENCOUNTER — Encounter: Payer: Self-pay | Admitting: *Deleted

## 2015-12-29 ENCOUNTER — Other Ambulatory Visit (INDEPENDENT_AMBULATORY_CARE_PROVIDER_SITE_OTHER): Payer: 59

## 2015-12-29 ENCOUNTER — Encounter: Payer: Self-pay | Admitting: Internal Medicine

## 2015-12-29 ENCOUNTER — Ambulatory Visit (INDEPENDENT_AMBULATORY_CARE_PROVIDER_SITE_OTHER): Payer: 59 | Admitting: Internal Medicine

## 2015-12-29 VITALS — BP 124/80 | HR 64 | Ht 69.0 in | Wt 200.1 lb

## 2015-12-29 DIAGNOSIS — R066 Hiccough: Secondary | ICD-10-CM

## 2015-12-29 DIAGNOSIS — R748 Abnormal levels of other serum enzymes: Secondary | ICD-10-CM

## 2015-12-29 LAB — COMPREHENSIVE METABOLIC PANEL
ALBUMIN: 4 g/dL (ref 3.5–5.2)
ALT: 82 U/L — ABNORMAL HIGH (ref 0–53)
AST: 77 U/L — ABNORMAL HIGH (ref 0–37)
Alkaline Phosphatase: 37 U/L — ABNORMAL LOW (ref 39–117)
BUN: 21 mg/dL (ref 6–23)
CHLORIDE: 100 meq/L (ref 96–112)
CO2: 29 mEq/L (ref 19–32)
Calcium: 9.1 mg/dL (ref 8.4–10.5)
Creatinine, Ser: 1.25 mg/dL (ref 0.40–1.50)
GFR: 69.49 mL/min (ref >60.00)
GLUCOSE: 80 mg/dL (ref 70–99)
POTASSIUM: 4.8 meq/L (ref 3.5–5.1)
Sodium: 135 mEq/L (ref 135–145)
Total Bilirubin: 0.6 mg/dL (ref 0.2–1.2)
Total Protein: 7.9 g/dL (ref 6.0–8.3)

## 2015-12-29 LAB — PROTIME-INR
INR: 1.1 ratio — AB (ref 0.8–1.0)
Prothrombin Time: 11.9 s (ref 9.6–13.1)

## 2015-12-29 MED ORDER — ALBUTEROL SULFATE HFA 108 (90 BASE) MCG/ACT IN AERS: 2.0000 | INHALATION_SPRAY | Freq: Four times a day (QID) | RESPIRATORY_TRACT | 0 refills | Status: DC | PRN

## 2015-12-29 MED ORDER — PANTOPRAZOLE SODIUM 40 MG PO TBEC: 40.0000 mg | DELAYED_RELEASE_TABLET | Freq: Every day | ORAL | 0 refills | Status: DC

## 2015-12-29 NOTE — Progress Notes (Signed)
Subjective:    Patient ID: Darren Freeman, male    DOB: 1980/09/14, 35 y.o.   MRN: [id number]  HPI Darren Freeman is a 35 year old male seen by me last in May 2014 with a history of elevated liver enzymes, hypertension, anxiety, hypogonadism who is seen in follow-up with a new complaint of hiccups. He reports he's had trouble with pickups in the past which she feels is related to using Xanax. He occasionally uses Xanax for anxiety previously using it on a fairly regular basis but now about once per month. He states that he used this about 2 weeks ago and developed hiccups which he states continued for 13 days. He would have periods where the pickups would stop for an hour but then would resume. He was seen in the ER for this issue and also had a CT scan of the abdomen and pelvis which I have reviewed. He reports the pickups interfered with sleep with the heartburn and also abdominal distention which he felt was due to swallowing air. This lead to soreness in his chest and epigastrium. He is also had burping and belching. He tried Tums, and Gas-X with little relief. During the pickups he reports heartburn but not much trouble with heartburn outside of the hiccups. He denies dysphagia and odynophagia but had some regurgitation of solid food when trying to eat with the hiccups. He denies abdominal pain. He reports his bowel movements as regular occurring 3-4 times per day. When he went to the ER he reported constipation but denied not having a bowel movement for a week. He denies blood in his stool or melena. Denies weight loss. Continues to actively lift weights and work out.  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  He requests primary care referral. He occasionally uses albuterol for chest tightness. He requests a refill. He does use Adderall and Celexa. He takes a multivitamin. He takes vitamin C. He continues testosterone injections once per week with 200  cc dose.   Review of Systems As per history of present illness, otherwise negative  Current Medications, Allergies, Past Medical History, Past Surgical History, Family History and Social History were reviewed in [facility] electronic medical record.     Objective:   Physical Exam BP 124/80 (BP Location: Left Arm, Patient Position: Sitting, Cuff Size: Normal)   Pulse 64   Ht 5\' 9"  (1.753 m)   Wt 200 lb 2 oz (90.8 kg)   BMI 29.55 kg/m  Constitutional: Well-developed and well-nourished. No distress. HEENT: Normocephalic and atraumatic. Oropharynx is clear and moist. No oropharyngeal exudate. Conjunctivae are normal.  No scleral icterus. Neck: Neck supple. Trachea midline. Cardiovascular: Normal rate, regular rhythm and intact distal pulses. No M/R/G Pulmonary/chest: Effort normal and breath sounds normal. No wheezing, rales or rhonchi. Abdominal: Soft, Mild epigastric tenderness without rebound or guarding, nondistended. Bowel sounds active throughout. There are no masses palpable. No hepatosplenomegaly. Extremities: no clubbing, cyanosis, or edema Neurological: Alert and oriented to person place and time. Skin: Skin is warm and dry. No rashes noted. Psychiatric: Normal mood and affect. Behavior is normal.  CBC    Component Value Date/Time   WBC 13.2 (H) 12/23/2015 1819   RBC 4.92 12/23/2015 1819   HGB 14.7 12/23/2015 1819   HCT 43.7 12/23/2015 1819   PLT 272 12/23/2015 1819   MCV 88.8 12/23/2015 1819   MCH 29.9 12/23/2015 1819   MCHC 33.6 12/23/2015 1819   RDW 14.9  12/23/2015 1819   LYMPHSABS 1.3 04/22/2013 1653   MONOABS 0.5 04/22/2013 1653   EOSABS 0.1 04/22/2013 1653   BASOSABS 0.0 04/22/2013 1653   CMP     Component Value Date/Time   NA 135 12/23/2015 1819   K 3.9 12/23/2015 1819   CL 99 (L) 12/23/2015 1819   CO2 26 12/23/2015 1819   GLUCOSE 90 12/23/2015 1819   BUN 35 (H) 12/23/2015 1819   CREATININE 1.50 (H) 12/23/2015 1819   CREATININE 1.36 (H)  04/22/2013 1653   CALCIUM 9.3 12/23/2015 1819   PROT 8.0 12/23/2015 1819   ALBUMIN 3.9 12/23/2015 1819   AST 58 (H) 12/23/2015 1819   ALT 67 (H) 12/23/2015 1819   ALKPHOS 40 12/23/2015 1819   BILITOT 0.8 12/23/2015 1819   GFRNONAA 59 (L) 12/23/2015 1819   GFRAA >60 12/23/2015 1819     CT ABDOMEN AND PELVIS WITH CONTRAST   TECHNIQUE: Multidetector CT imaging of the abdomen and pelvis was performed using the standard protocol following bolus administration of intravenous contrast.   CONTRAST:  ISOVUE-300 IOPAMIDOL (ISOVUE-300) INJECTION 61%   COMPARISON:  CT abdomen pelvis 02/07/2010   FINDINGS: Lower chest: No pulmonary nodules. No visible pleural or pericardial effusion.   Hepatobiliary: Small hypodensity in the right hepatic lobe, favored to be hepatic cyst or biliary hamartomas. No other focal liver lesion. No ascites. No biliary dilatation. Normal gallbladder.   Pancreas: Normal pancreatic contours and enhancement. No peripancreatic fluid collection or pancreatic ductal dilatation.   Spleen: Normal.   Adrenals/Urinary Tract: Normal adrenal glands. No hydronephrosis or solid renal mass.   Stomach/Bowel: No abnormal bowel dilatation. No bowel wall thickening or adjacent fat stranding to indicate acute inflammation. No abdominal fluid collection. Normal appendix.   Vascular/Lymphatic: Normal course and caliber of the major abdominal vessels. No abdominal or pelvic adenopathy.   Reproductive: Normal prostate and seminal vesicles.   Musculoskeletal: No lytic or blastic osseous lesion. Normal visualized extrathoracic and extraperitoneal soft tissues.   Other: No contributory non-categorized findings.   IMPRESSION: No acute abnormality of the abdomen or pelvis.     Electronically Signed   By: Deatra Robinson M.D.   On: 12/23/2015 23:15      Assessment & Plan:  35 year old male seen by me last in May 2014 with a history of elevated liver enzymes,  hypertension, anxiety, hypogonadism who is seen in follow-up with a new complaint of hiccups.  1. Hiccups -- had been intractable but fortunately stopped today. This may be secondary to reflux and I recommended 1 month the pantoprazole 40 mg daily, 30 mins before breakfast. If hiccups return despite PPI therapy I would recommend upper endoscopy. If they do return and remain intractable I'm willing to try chlorpromazine. He will call back if the hiccups returned.  2. Elevated liver enzymes -- liver enzymes have remained elevated, though modestly. Fatty liver was entertained not seen by CT recently. The CT scan was reassuring. This may be secondary to medication, specifically testosterone which we previously discussed. I would like him to discuss testosterone replacement with primary care for other options if possible. We also discussed the importance of eliminated medications causing hepatotoxicity as long-term liver inflammation can lead to scarring. He was referred to see Dr. Corky Sing at [facility] but did not show for this appointment.  3. Chest tightness -- discuss with primary care, refill albuterol 1 month  4. Need for primary care -- referral to primary care placed  Repeat CMP and INR today  25  minutes spent with the patient today. Greater than 50% was spent in counseling and coordination of care with the patient

## 2015-12-29 NOTE — Patient Instructions (Addendum)
If you are age 36 or older, your body mass index should be between 23-30. Your Body mass index is 29.55 kg/m. If this is out of the aforementioned range listed, please consider follow up with your Primary Care Provider.  If you are age 36 or younger, your body mass index should be between 19-25. Your Body mass index is 29.55 kg/m. If this is out of the aformentioned range listed, please consider follow up with your Primary Care Provider.   Your physician has requested that you go to the basement for lab work before leaving today:  We have sent the following medications to your pharmacy for you to pick up at your convenience: Pantoprazole 40mg  daily for 1 month Refilled Albuterol Inhaler  Dr Rhea BeltonPyrtle has referred you to Dr Annice PihSteven Hunter [facility]. You have been scheduled on 01/11/16 at 11:15 at his office. Their contact number is [phone].

## 2016-01-10 ENCOUNTER — Telehealth: Payer: Self-pay | Admitting: Internal Medicine

## 2016-01-10 MED ORDER — CHLORPROMAZINE HCL 25 MG PO TABS: 25.0000 mg | ORAL_TABLET | Freq: Three times a day (TID) | ORAL | 0 refills | Status: DC

## 2016-01-10 NOTE — Telephone Encounter (Signed)
Pt aware, script sent to pharmacy. Pt states he will call back to schedule once he looks at his schedule.

## 2016-01-10 NOTE — Telephone Encounter (Signed)
Pt states that his hiccups have returned and that Dr. Rhea BeltonPyrtle mentioned prescribing a new med if they came back. Note below from previous OV note.   1. Hiccups -- had been intractable but fortunately stopped today. This may be secondary to reflux and I recommended 1 month the pantoprazole 40 mg daily, 30 mins before breakfast. If hiccups return despite PPI therapy I would recommend upper endoscopy. If they do return and remain intractable I'm willing to try chlorpromazine. He will call back if the hiccups returned.  Please advise.

## 2016-01-10 NOTE — Telephone Encounter (Signed)
EGD recommended Chlorpromazine 25 mg 3 times a day 2-3 days; this should break the hiccups cycle. Once hiccups stop, this medication should be discontinued. If he needs this medication on a regular ongoing basis he needs to make me aware Give #30 for 1st Rx

## 2016-01-10 NOTE — Telephone Encounter (Signed)
Left message for pt to call back  °

## 2016-01-11 ENCOUNTER — Ambulatory Visit: Payer: 59 | Admitting: Family Medicine

## 2016-01-21 ENCOUNTER — Ambulatory Visit (INDEPENDENT_AMBULATORY_CARE_PROVIDER_SITE_OTHER): Payer: 59 | Admitting: Family Medicine

## 2016-01-21 ENCOUNTER — Encounter: Payer: Self-pay | Admitting: Family Medicine

## 2016-01-21 VITALS — BP 138/78 | HR 60 | Temp 98.0°F | Ht 69.5 in | Wt 199.6 lb

## 2016-01-21 DIAGNOSIS — R740 Nonspecific elevation of levels of transaminase and lactic acid dehydrogenase [LDH]: Secondary | ICD-10-CM

## 2016-01-21 DIAGNOSIS — E785 Hyperlipidemia, unspecified: Secondary | ICD-10-CM

## 2016-01-21 DIAGNOSIS — I10 Essential (primary) hypertension: Secondary | ICD-10-CM

## 2016-01-21 DIAGNOSIS — G4700 Insomnia, unspecified: Secondary | ICD-10-CM | POA: Insufficient documentation

## 2016-01-21 DIAGNOSIS — R7401 Elevation of levels of liver transaminase levels: Secondary | ICD-10-CM

## 2016-01-21 DIAGNOSIS — Z23 Encounter for immunization: Secondary | ICD-10-CM

## 2016-01-21 DIAGNOSIS — E291 Testicular hypofunction: Secondary | ICD-10-CM

## 2016-01-21 DIAGNOSIS — Z7251 High risk heterosexual behavior: Secondary | ICD-10-CM | POA: Diagnosis not present

## 2016-01-21 DIAGNOSIS — K219 Gastro-esophageal reflux disease without esophagitis: Secondary | ICD-10-CM | POA: Insufficient documentation

## 2016-01-21 DIAGNOSIS — J45909 Unspecified asthma, uncomplicated: Secondary | ICD-10-CM | POA: Insufficient documentation

## 2016-01-21 DIAGNOSIS — R066 Hiccough: Secondary | ICD-10-CM | POA: Insufficient documentation

## 2016-01-21 DIAGNOSIS — Z7289 Other problems related to lifestyle: Secondary | ICD-10-CM | POA: Diagnosis not present

## 2016-01-21 DIAGNOSIS — J4520 Mild intermittent asthma, uncomplicated: Secondary | ICD-10-CM

## 2016-01-21 DIAGNOSIS — F329 Major depressive disorder, single episode, unspecified: Secondary | ICD-10-CM | POA: Insufficient documentation

## 2016-01-21 DIAGNOSIS — F32A Depression, unspecified: Secondary | ICD-10-CM | POA: Insufficient documentation

## 2016-01-21 MED ORDER — ALBUTEROL SULFATE HFA 108 (90 BASE) MCG/ACT IN AERS: 2.0000 | INHALATION_SPRAY | Freq: Four times a day (QID) | RESPIRATORY_TRACT | 5 refills | Status: DC | PRN

## 2016-01-21 NOTE — Progress Notes (Signed)
Pre visit review using our clinic review tool, if applicable. No additional management support is needed unless otherwise documented below in the visit note. 

## 2016-01-21 NOTE — Patient Instructions (Signed)
Schedule a lab visit at the check out desk within 2 weeks. Return for future fasting labs meaning nothing but water after midnight please. Ok to take your medications with water.   Obviously I would advise to cut out the testosterone and then we could recheck and get you on right medical levels but appreciate your honestly that you will probably continue the same. Also would recommend cutting out the marijuana  Tdap, flu shot before you leave

## 2016-01-21 NOTE — Progress Notes (Signed)
Phone: [phone]  Subjective:  Patient presents today to establish care. Chief complaint-noted.   See problem oriented charting  The following were reviewed and entered/updated in epic: Past Medical History:  Diagnosis Date  . ADD (attention deficit disorder)   . Anxiety   . Asthma    ? allergy related. can run and no issues. sometimes wheeze or SOB after lifting or sex  . Depression   . Elevated LFTs   . GERD (gastroesophageal reflux disease)   . Glucose intolerance (impaired glucose tolerance)    previously on metformin  . HTN (hypertension)   . Hypogonadism male   . Low testosterone   . Vitamin D deficiency    Patient Active Problem List   Diagnosis Date Noted  . Testosterone Deficiency 09/06/2011    Priority: High  . Intractable hiccups 01/21/2016    Priority: Medium  . Depression 01/21/2016    Priority: Medium  . Asthma     Priority: Medium  . Hyperlipidemia 08/18/2013    Priority: Medium  . Essential hypertension 08/18/2013    Priority: Medium  . ADD (attention deficit disorder)     Priority: Medium  . GERD (gastroesophageal reflux disease) 01/21/2016    Priority: Low  . Insomnia 01/21/2016    Priority: Low  . Abnormal glucose 08/18/2013    Priority: Low  . Vitamin D deficiency     Priority: Low  . Elevated transaminase level 01/22/2016   Past Surgical History:  Procedure Laterality Date  . penile surgery as child- thin urethra      Family History  Problem Relation Age of Onset  . Breast cancer Paternal Grandmother   . Depression Mother     behavioral health admissions 3-4x  . Hyperlipidemia Father   . Hypertension Father   . Depression Sister   . Depression Brother   . Diabetes Paternal Grandfather   . Colon cancer Neg Hx     Medications- reviewed and updated Current Outpatient Prescriptions  Medication Sig Dispense Refill  . albuterol (PROVENTIL HFA;VENTOLIN HFA) 108 (90 Base) MCG/ACT inhaler Inhale 2 puffs into the lungs every 6  (six) hours as needed for wheezing or shortness of breath. 1 Inhaler 5  . Ascorbic Acid (VITAMIN C) 1000 MG tablet Take 1,000 mg by mouth daily.     . chlorproMAZINE (THORAZINE) 25 MG tablet Take 1 tablet (25 mg total) by mouth 3 (three) times daily. 30 tablet 0  . citalopram (CELEXA) 40 MG tablet TAKE 1 TABLET BY MOUTH EVERY DAY 30 tablet 1  . Fish Oil-Cholecalciferol (FISH OIL + D3) 1200-1000 MG-UNIT CAPS Take by mouth 4 (four) times daily.     . Multiple Vitamin (MULTIVITAMIN) tablet Take 1 tablet by mouth daily.    . pantoprazole (PROTONIX) 40 MG tablet Take 1 tablet (40 mg total) by mouth daily. 30 tablet 0  . Probiotic Product (ALIGN) 4 MG CAPS Take 1 capsule by mouth daily. 30 capsule 2  . trazodone (DESYREL) 300 MG tablet Take 150 mg by mouth at bedtime.    . ALPRAZolam (XANAX) 1 MG tablet Take 1 mg by mouth 2 (two) times daily as needed for anxiety.     Marland Kitchen. amphetamine-dextroamphetamine (ADDERALL) 30 MG tablet Take 15 mg by mouth daily.     No current facility-administered medications for this visit.     Allergies-reviewed and updated No Known Allergies  Social History   Social History  . Marital status: Single    Spouse name: N/A  . Number of children: 0  .  Years of education: N/A   Occupational History  . manager [employer]   Social History Main Topics  . Smoking status: Former Smoker    Quit date: 07/30/2001  . Smokeless tobacco: Never Used  . Alcohol use No  . Drug use:     Types: Marijuana     Comment: previously used cocaine (quit x 2 years)  . Sexual activity: Yes   Other Topics Concern  . None   Social History Narrative   Lives in [place] with fiancee. Getting married November 2018. Angus [place]ms (born December 2016), one on the way due 2018- may 4th      General manager/owner at [employer]      Hobbies: workout, concerts   Dad in town to babysit          ROS--Full ROS was completed Review of Systems  Constitutional: Negative  for chills and fever.  HENT: Negative for hearing loss.   Eyes: Negative for blurred vision and double vision.  Respiratory: Positive for shortness of breath (at times when asthma flares) and wheezing. Negative for cough and hemoptysis.   Cardiovascular: Negative for chest pain and palpitations.  Gastrointestinal: Positive for heartburn. Negative for nausea.  Genitourinary: Negative for dysuria and urgency.  Musculoskeletal: Negative for myalgias and neck pain.  Skin: Negative for itching and rash.  Neurological: Negative for dizziness, tingling and headaches.  Endo/Heme/Allergies: Negative for polydipsia. Does not bruise/bleed easily.  Psychiatric/Behavioral: Negative for depression and suicidal ideas.   Objective: BP 138/78 (BP Location: Left Arm, Patient Position: Sitting, Cuff Size: Large)   Pulse 60   Temp 98 F (36.7 C) (Oral)   Ht 5' 9.5" (1.765 m)   Wt 199 lb 9.6 oz (90.5 kg)   SpO2 97%   BMI 29.05 kg/m  Gen: NAD, resting comfortably, muscular build HEENT: Mucous membranes are moist. Oropharynx normal. TM normal. Eyes: sclera and lids normal, PERRLA Neck: no thyromegaly, no cervical lymphadenopathy CV: RRR no murmurs rubs or gallops Lungs: CTAB no crackles, wheeze, rhonchi Abdomen: soft/nontender/nondistended/normal bowel sounds. No rebound or guarding.  Ext: no edema Skin: warm, dry Neuro: 5/5 strength in upper and lower extremities, normal gait, normal reflexes  No testicular exam  Assessment/Plan:  Testosterone Deficiency S: history of testosterone deficiency with severe fatigue and depressed mood. Started supplements under medical care but has also since that time taken up bodybuilding and [place]  A/P: strongly counseled patient against using these substances. He declines at present. Discussed risks. Including his already elevated LFTs- last level was over 1700 so he at least agreed to check levels and consider cutting his dose.  States he is doing 250mg  into his glutes of Sustanon 3x a week.    Essential hypertension S: History of HTN. Wonders if related to marijuana at night improving BP.  BP Readings from Last 3 Encounters:  01/21/16 138/78  12/29/15 124/80  12/23/15 114/76  A/P: BP is at goal- I advised against marijuana use even if it is beneficial for BP and we could follow up with medical treatments   Asthma S: ? allergy related. can run and no issues. sometimes wheeze or SOB after lifting or sex. Has run out of albuterol A/P: refill albuterol and can use pre sex or preworkout for potential exercise induced asthma   Hyperlipidemia S: history of high cholesterol A/P: come back for fasting lipids   Elevated transaminase level S: could be testosterone related- advised to stop using [place] A/P: also update HCV and HBV tests   6 months for CPE, come back for fasting labs though  Orders Placed This Encounter  Procedures  . Flu Vaccine QUAD 36+ mos IM  . Tdap vaccine greater than or equal to 7yo IM  . CBC    [place]    Standing Status:   Future    Standing Expiration Date:   01/20/2017  . Comprehensive metabolic panel    [place]    Standing Status:   Future    Standing Expiration Date:   01/20/2017    Order Specific Question:   Has the patient fasted?    Answer:   No  . Lipid panel    [place]    Standing Status:   Future    Standing Expiration Date:   01/20/2017    Order Specific Question:   Has the patient fasted?    Answer:   No  . Hepatitis C antibody, reflex    [facility]    Standing Status:   Future    Standing Expiration Date:   01/20/2017  . Hepatitis B surface antibody    Standing Status:   Future    Standing Expiration Date:   01/20/2017  . Hepatitis B surface antigen    Standing Status:   Future    Standing Expiration Date:   01/20/2017  . Hepatitis B core antibody, total    Standing Status:   Future    Standing Expiration Date:   01/20/2017  .  HIV antibody    [facility]    Standing Status:   Future    Standing Expiration Date:   01/20/2017  . Testosterone    Standing Status:   Future    Standing Expiration Date:   01/20/2017    Meds ordered this encounter  Medications  . albuterol (PROVENTIL HFA;VENTOLIN HFA) 108 (90 Base) MCG/ACT inhaler    Sig: Inhale 2 puffs into the lungs every 6 (six) hours as needed for wheezing or shortness of breath.    Dispense:  1 Inhaler    Refill:  5    Return precautions advised.  Tana Conch, MD

## 2016-01-22 DIAGNOSIS — R740 Nonspecific elevation of levels of transaminase and lactic acid dehydrogenase [LDH]: Secondary | ICD-10-CM

## 2016-01-22 DIAGNOSIS — R7401 Elevation of levels of liver transaminase levels: Secondary | ICD-10-CM | POA: Insufficient documentation

## 2016-01-22 NOTE — Assessment & Plan Note (Signed)
S: history of high cholesterol A/P: come back for fasting lipids

## 2016-01-22 NOTE — Assessment & Plan Note (Signed)
S: ? allergy related. can run and no issues. sometimes wheeze or SOB after lifting or sex. Has run out of albuterol A/P: refill albuterol and can use pre sex or preworkout for potential exercise induced asthma

## 2016-01-22 NOTE — Assessment & Plan Note (Signed)
S: history of testosterone deficiency with severe fatigue and depressed mood. Started supplements under medical care but has also since that time taken up bodybuilding and [place] from [place].  A/P: strongly counseled patient against using these substances. He declines at present. Discussed risks. Including his already elevated LFTs- last level was over 1700 so he at least agreed to check levels and consider cutting his dose. States he is doing 250mg  into his glutes of Sustanon 3x a week.

## 2016-01-22 NOTE — Assessment & Plan Note (Signed)
S: History of HTN. Wonders if related to marijuana at night improving BP.  BP Readings from Last 3 Encounters:  01/21/16 138/78  12/29/15 124/80  12/23/15 114/76  A/P: BP is at goal- I advised against marijuana use even if it is beneficial for BP and we could follow up with medical treatments

## 2016-01-22 NOTE — Assessment & Plan Note (Signed)
S: could be testosterone related- advised to stop using [place] gets [place] A/P: also update HCV and HBV tests

## 2016-01-24 ENCOUNTER — Other Ambulatory Visit: Payer: Self-pay | Admitting: Internal Medicine

## 2016-01-24 ENCOUNTER — Other Ambulatory Visit (INDEPENDENT_AMBULATORY_CARE_PROVIDER_SITE_OTHER): Payer: 59

## 2016-01-24 DIAGNOSIS — Z7289 Other problems related to lifestyle: Secondary | ICD-10-CM

## 2016-01-24 DIAGNOSIS — E291 Testicular hypofunction: Secondary | ICD-10-CM

## 2016-01-24 DIAGNOSIS — E785 Hyperlipidemia, unspecified: Secondary | ICD-10-CM

## 2016-01-24 DIAGNOSIS — Z7251 High risk heterosexual behavior: Secondary | ICD-10-CM

## 2016-01-24 LAB — COMPREHENSIVE METABOLIC PANEL
ALBUMIN: 4 g/dL (ref 3.5–5.2)
ALK PHOS: 32 U/L — AB (ref 39–117)
ALT: 54 U/L — AB (ref 0–53)
AST: 68 U/L — ABNORMAL HIGH (ref 0–37)
BILIRUBIN TOTAL: 0.4 mg/dL (ref 0.2–1.2)
BUN: 20 mg/dL (ref 6–23)
CALCIUM: 8.8 mg/dL (ref 8.4–10.5)
CO2: 24 mEq/L (ref 19–32)
Chloride: 104 mEq/L (ref 96–112)
Creatinine, Ser: 1.22 mg/dL (ref 0.40–1.50)
GFR: 71.43 mL/min (ref >60.00)
Glucose, Bld: 86 mg/dL (ref 70–99)
Potassium: 4.2 mEq/L (ref 3.5–5.1)
Sodium: 136 mEq/L (ref 135–145)
TOTAL PROTEIN: 7.3 g/dL (ref 6.0–8.3)

## 2016-01-24 LAB — LIPID PANEL
CHOLESTEROL: 161 mg/dL (ref 0–200)
HDL: 27.8 mg/dL — AB (ref >39.00)
LDL Cholesterol: 111 mg/dL — ABNORMAL HIGH (ref 0–99)
NonHDL: 133.02
TRIGLYCERIDES: 108 mg/dL (ref 0.0–149.0)
Total CHOL/HDL Ratio: 6
VLDL: 21.6 mg/dL (ref 0.0–40.0)

## 2016-01-24 LAB — CBC
HCT: 43.5 % (ref 39.0–52.0)
HEMOGLOBIN: 14.9 g/dL (ref 13.0–17.0)
MCHC: 34.2 g/dL (ref 30.0–36.0)
MCV: 86.8 fl (ref 78.0–100.0)
PLATELETS: 293 10*3/uL (ref 150.0–400.0)
RBC: 5.01 Mil/uL (ref 4.22–5.81)
RDW: 14.9 % (ref 11.5–15.5)
WBC: 7.4 10*3/uL (ref 4.0–10.5)

## 2016-01-24 LAB — TESTOSTERONE: Testosterone: 946.1 ng/dL — ABNORMAL HIGH (ref 300.00–890.00)

## 2016-01-25 LAB — HEPATITIS B CORE ANTIBODY, TOTAL: Hep B Core Total Ab: NONREACTIVE

## 2016-01-25 LAB — HEPATITIS B SURFACE ANTIBODY,QUALITATIVE: Hep B S Ab: POSITIVE — AB

## 2016-01-25 LAB — HEPATITIS C ANTIBODY: HCV AB: NEGATIVE

## 2016-01-25 LAB — HEPATITIS B SURFACE ANTIGEN: Hepatitis B Surface Ag: NEGATIVE

## 2016-01-25 LAB — HIV ANTIBODY (ROUTINE TESTING W REFLEX): HIV: NONREACTIVE

## 2016-02-22 ENCOUNTER — Other Ambulatory Visit: Payer: Self-pay | Admitting: Internal Medicine

## 2016-03-22 ENCOUNTER — Ambulatory Visit (INDEPENDENT_AMBULATORY_CARE_PROVIDER_SITE_OTHER): Payer: 59 | Admitting: Family Medicine

## 2016-03-22 ENCOUNTER — Encounter: Payer: Self-pay | Admitting: Family Medicine

## 2016-03-22 VITALS — BP 124/84 | HR 75 | Temp 97.8°F | Ht 69.5 in | Wt 199.6 lb

## 2016-03-22 DIAGNOSIS — L02416 Cutaneous abscess of left lower limb: Secondary | ICD-10-CM | POA: Diagnosis not present

## 2016-03-22 MED ORDER — DOXYCYCLINE HYCLATE 100 MG PO CAPS: 100.0000 mg | ORAL_CAPSULE | Freq: Two times a day (BID) | ORAL | 0 refills | Status: DC

## 2016-03-22 NOTE — Progress Notes (Signed)
Subjective:     Patient ID: Russella DarMarc Andrew Anaya, male   DOB: 11/07/1980, 35 y.o.   MRN: [id number]  HPI Patient seen with several month history of recurrent pustular lesions mostly involving lower extremities but also on his trunk and forearms occasionally. No known history of MRSA. He's not had any recent fever or chills. Biggest lesion is left posterior thigh. This has been draining some pus over the past few weeks intermittently. No known exposures to anyone with MRSA. He uses some type of homemade soap. No history of diabetes.  Past Medical History:  Diagnosis Date  . ADD (attention deficit disorder)   . Anxiety   . Asthma    ? allergy related. can run and no issues. sometimes wheeze or SOB after lifting or sex  . Depression   . Elevated LFTs   . GERD (gastroesophageal reflux disease)   . Glucose intolerance (impaired glucose tolerance)    previously on metformin  . HTN (hypertension)   . Hypogonadism male   . Low testosterone   . Vitamin D deficiency    Past Surgical History:  Procedure Laterality Date  . penile surgery as child- thin urethra      reports that he quit smoking about 14 years ago. He has never used smokeless tobacco. He reports that he uses drugs, including Marijuana. He reports that he does not drink alcohol. family history includes Breast cancer in his paternal grandmother; Depression in his brother, mother, and sister; Diabetes in his paternal grandfather; Hyperlipidemia in his father; Hypertension in his father. No Known Allergies   Review of Systems  Constitutional: Negative for chills and fever.  Gastrointestinal: Negative for nausea and vomiting.       Objective:   Physical Exam  Constitutional: He appears well-developed and well-nourished.  Cardiovascular: Normal rate and regular rhythm.   Pulmonary/Chest: Effort normal and breath sounds normal. No respiratory distress. He has no wheezes. He has no rales.  Skin:  Left posterior thigh patient  has approximately 1 cm area of induration and mild erythema. He has a couple of areas of purulent drainage but no fluctuance. Nontender. Has a couple of smaller indurated areas including right forearm and left side that are somewhat similar but not draining any purulence at this time       Assessment:     Recurrent nodular and pustular lesions. Very likely staph. Rule out MRSA.  Nontoxic and no cellulitis changes.  No indications for I and D at this time.      Plan:     -We cultured lesion left posterior thigh -Start doxycycline 100 mg twice a day for 2 weeks pending culture results -Consider intranasal Bactroban if positive for MRSA -Follow-up immediately for a fever or worsening symptoms  Kristian CoveyBruce W Anelis Hrivnak MD [facility]

## 2016-03-22 NOTE — Progress Notes (Signed)
Pre visit review using our clinic review tool, if applicable. No additional management support is needed unless otherwise documented below in the visit note. 

## 2016-03-22 NOTE — Patient Instructions (Signed)
Follow up for any fever or new skin lesions We will call you with skin culture.

## 2016-03-25 LAB — WOUND CULTURE
GRAM STAIN: NONE SEEN
GRAM STAIN: NONE SEEN

## 2016-09-05 ENCOUNTER — Other Ambulatory Visit: Payer: Self-pay | Admitting: Internal Medicine

## 2016-09-05 NOTE — Telephone Encounter (Signed)
Left a message for patient to return my call. 

## 2016-09-06 NOTE — Telephone Encounter (Signed)
Left voicemail for patient to call back. 

## 2016-09-07 NOTE — Telephone Encounter (Signed)
Patient states that his hiccups have returned intermittently and he has just had accupuncture to help. Since he was last seen 12/2015 and never had any of the follow up tests, I have asked that he come back to the office for re-eval. Patient states that he is currently on the treadmill and would like to call back to schedule an appointment.

## 2016-11-21 ENCOUNTER — Ambulatory Visit (INDEPENDENT_AMBULATORY_CARE_PROVIDER_SITE_OTHER): Payer: BLUE CROSS/BLUE SHIELD | Admitting: Family Medicine

## 2016-11-21 ENCOUNTER — Encounter: Payer: Self-pay | Admitting: Family Medicine

## 2016-11-21 VITALS — BP 110/80 | HR 87 | Temp 97.9°F | Wt 200.1 lb

## 2016-11-21 DIAGNOSIS — T8090XA Unspecified complication following infusion and therapeutic injection, initial encounter: Secondary | ICD-10-CM | POA: Diagnosis not present

## 2016-11-21 NOTE — Patient Instructions (Signed)
Watch out for any for any redness, warmth, or fluctuance.

## 2016-11-21 NOTE — Progress Notes (Signed)
Subjective:     Patient ID: Darren Freeman, male   DOB: January 24, 1981, 36 y.o.   MRN: [id number]  HPI Patient is seen with local reaction right buttock at the site of testosterone injection which he administers himself weekly. This is non-prescribed testosterone. He states he is on dosage of 500 mg per week. He has had some induration at the site. Minimal soreness. No warmth. No erythema. No pustular changes. He had some leftover Augmentin and prednisone and started both of these yesterday. Denies any fever or chills.  Past Medical History:  Diagnosis Date  . ADD (attention deficit disorder)   . Anxiety   . Asthma    ? allergy related. can run and no issues. sometimes wheeze or SOB after lifting or sex  . Depression   . Elevated LFTs   . GERD (gastroesophageal reflux disease)   . Glucose intolerance (impaired glucose tolerance)    previously on metformin  . HTN (hypertension)   . Hypogonadism male   . Low testosterone   . Vitamin D deficiency    Past Surgical History:  Procedure Laterality Date  . penile surgery as child- thin urethra      reports that he quit smoking about 15 years ago. He has never used smokeless tobacco. He reports that he uses drugs, including Marijuana. He reports that he does not drink alcohol. family history includes Breast cancer in his paternal grandmother; Depression in his brother, mother, and sister; Diabetes in his paternal grandfather; Hyperlipidemia in his father; Hypertension in his father. No Known Allergies   Review of Systems  Constitutional: Negative for chills and fever.       Objective:   Physical Exam  Constitutional: He appears well-developed and well-nourished.  Cardiovascular: Normal rate and regular rhythm.   Pulmonary/Chest: Effort normal and breath sounds normal. No respiratory distress. He has no wheezes. He has no rales.  Skin:  Right buttock over the gluteus region approximately 1 cm area of induration in the sub-dermal  region. There is no overlying erythema. No warmth. No fluctuance. Minimally tender.       Assessment:     Probable local injection reaction. Doubt abscess    Plan:     -He will try some heat and observe. -Follow-up promptly for any fever, redness, increased swelling, warmth, or fluctuance  Kristian CoveyBruce W Burchette MD [facility]

## 2016-12-26 DIAGNOSIS — C44519 Basal cell carcinoma of skin of other part of trunk: Secondary | ICD-10-CM | POA: Diagnosis not present

## 2016-12-26 DIAGNOSIS — L821 Other seborrheic keratosis: Secondary | ICD-10-CM | POA: Diagnosis not present

## 2016-12-26 DIAGNOSIS — Z85828 Personal history of other malignant neoplasm of skin: Secondary | ICD-10-CM | POA: Diagnosis not present

## 2016-12-26 DIAGNOSIS — L910 Hypertrophic scar: Secondary | ICD-10-CM | POA: Diagnosis not present

## 2017-02-28 ENCOUNTER — Ambulatory Visit: Payer: BLUE CROSS/BLUE SHIELD | Admitting: Family Medicine

## 2017-02-28 ENCOUNTER — Encounter: Payer: Self-pay | Admitting: Family Medicine

## 2017-02-28 VITALS — BP 122/84 | HR 58 | Temp 98.6°F | Ht 69.5 in | Wt 192.4 lb

## 2017-02-28 DIAGNOSIS — R7401 Elevation of levels of liver transaminase levels: Secondary | ICD-10-CM

## 2017-02-28 DIAGNOSIS — R740 Nonspecific elevation of levels of transaminase and lactic acid dehydrogenase [LDH]: Secondary | ICD-10-CM

## 2017-02-28 DIAGNOSIS — E291 Testicular hypofunction: Secondary | ICD-10-CM | POA: Diagnosis not present

## 2017-02-28 DIAGNOSIS — F329 Major depressive disorder, single episode, unspecified: Secondary | ICD-10-CM | POA: Diagnosis not present

## 2017-02-28 DIAGNOSIS — Z79899 Other long term (current) drug therapy: Secondary | ICD-10-CM | POA: Diagnosis not present

## 2017-02-28 DIAGNOSIS — I10 Essential (primary) hypertension: Secondary | ICD-10-CM | POA: Diagnosis not present

## 2017-02-28 DIAGNOSIS — F988 Other specified behavioral and emotional disorders with onset usually occurring in childhood and adolescence: Secondary | ICD-10-CM

## 2017-02-28 DIAGNOSIS — F32A Depression, unspecified: Secondary | ICD-10-CM

## 2017-02-28 NOTE — Patient Instructions (Signed)
Glad you are doing well  Congrats on the wedding!   Schedule a lab visit at the check out desk within 2 weeks. Return for future fasting labs meaning nothing but water after midnight please. Ok to take your medications with water. Would make sure this is at least 2 weeks out from last injection- thrilled you are stopping!

## 2017-02-28 NOTE — Assessment & Plan Note (Signed)
Prior through Dr. Evelene CroonKaur- weaned himself off adderrall

## 2017-02-28 NOTE — Assessment & Plan Note (Signed)
off xanax. Down to 10mg  citalopram. 1/3 trazodone every other day. Feels good. Denies depressed mood or anhedonia. At risk for depressed mood when testosterone gets low.

## 2017-02-28 NOTE — Assessment & Plan Note (Signed)
S: history of hypertension. controlled on no meds at present.  BP Readings from Last 3 Encounters:  11/21/16 110/80  03/22/16 124/84  01/21/16 138/78  A/P: We discussed blood pressure goal of <140/90. Continue no meds- could be from being off adderrall

## 2017-02-28 NOTE — Assessment & Plan Note (Signed)
S: prior 250mg  into his glutes of sustanon 3x a week. Now on enanthate 3x a week. Took last shot on Monday.  States when on rx through doctor had been as high as 400mg  every 2 weeks.   He tells me without meds in past as low as 38 and felt depressed mood with this as well as low energy.   Last year testossterone was above goal and I encouraged cutting back/stopping Lab Results  Component Value Date   TESTOSTERONE 946.10 (H) 01/24/2016  A/P: Patient would like to transition back to rx. Will come off home testosterone for 2 weeks then come in for testing. Also get cbc, cmp. He [facility] [place]. 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  -also get cbc, cmp along with testosterone. Discussed likely use up to 3 readings - well depending on how low #s are.   He would liek to restart injections. Also need to monitor LFTs which were up last year.

## 2017-02-28 NOTE — Progress Notes (Signed)
Subjective:  Darren Freeman is a 36 y.o. year old very pleasant male patient who presents for/with See problem oriented charting ROS- No chest pain or shortness of breath. No headache or blurry vision.  No RUQ pain   Past Medical History-  Patient Active Problem List   Diagnosis Date Noted  . Testosterone Deficiency 09/06/2011    Priority: High  . Elevated transaminase level 01/22/2016    Priority: Medium  . Intractable hiccups 01/21/2016    Priority: Medium  . Depression 01/21/2016    Priority: Medium  . Asthma     Priority: Medium  . Hyperlipidemia 08/18/2013    Priority: Medium  . Essential hypertension 08/18/2013    Priority: Medium  . ADD (attention deficit disorder)     Priority: Medium  . GERD (gastroesophageal reflux disease) 01/21/2016    Priority: Low  . Insomnia 01/21/2016    Priority: Low  . Abnormal glucose 08/18/2013    Priority: Low  . Vitamin D deficiency     Priority: Low    Medications- reviewed and updated Current Outpatient Medications  Medication Sig Dispense Refill  . albuterol (PROVENTIL HFA;VENTOLIN HFA) 108 (90 Base) MCG/ACT inhaler Inhale 2 puffs into the lungs every 6 (six) hours as needed for wheezing or shortness of breath. 1 Inhaler 5  . Ascorbic Acid (VITAMIN C) 1000 MG tablet Take 1,000 mg by mouth daily.     . citalopram (CELEXA) 10 MG tablet Take 10 mg by mouth every other day.    . trazodone (DESYREL) 300 MG tablet Take 100 mg by mouth every other day.     . Multiple Vitamin (MULTIVITAMIN) tablet Take 1 tablet by mouth daily.     No current facility-administered medications for this visit.     Objective: BP 122/84 (BP Location: Left Arm, Patient Position: Sitting, Cuff Size: Large)   Pulse (!) 58   Temp 98.6 F (37 C) (Oral)   Ht 5' 9.5" (1.765 m)   Wt 192 lb 6.4 oz (87.3 kg)   SpO2 97%   BMI 28.01 kg/m  Gen: NAD, resting comfortably, high level muscular build CV: RRR no murmurs rubs or gallops Lungs: CTAB no crackles,  wheeze, rhonchi Ext: no edema Skin: warm, dry  Assessment/Plan:  Essential hypertension S: history of hypertension. controlled on no meds at present.  BP Readings from Last 3 Encounters:  11/21/16 110/80  03/22/16 124/84  01/21/16 138/78  A/P: We discussed blood pressure goal of <140/90. Continue no meds- could be from being off adderrall  ADD (attention deficit disorder) Prior through Dr. Evelene CroonKaur- weaned himself off adderrall  Depression off xanax. Down to 10mg  citalopram. 1/3 trazodone every other day. Feels good. Denies depressed mood or anhedonia. At risk for depressed mood when testosterone gets low.   Testosterone Deficiency S: prior 250mg  into his glutes of sustanon 3x a week. Now on enanthate 3x a week. Took last shot on Monday.  States when on rx through doctor had been as high as 400mg  every 2 weeks.   He tells me without meds in past as low as 38 and felt depressed mood with this as well as low energy.   Last year testossterone was above goal and I encouraged cutting back/stopping Lab Results  Component Value Date   TESTOSTERONE 946.10 (H) 01/24/2016  A/P: Patient would like to transition back to rx. Will come off home testosterone for 2 weeks then come in for testing. Also get cbc, cmp. He will [facility] info through [facility] paperwork.  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  -also get cbc, cmp along with testosterone. Discussed likely use up to 3 readings - well depending on how low #s are.   He would liek to restart injections. Also need to monitor LFTs which were up last year.   Declines flu Future Appointments  Date Time Provider Department Center  03/15/2017  8:30 AM [facility] LAB [facility] None   Orders Placed This Encounter  Procedures  . CBC    Standing Status:   Future    Standing Expiration Date:   02/28/2018  . Comprehensive metabolic panel    [place]    Standing Status:   Future    Standing Expiration Date:   02/28/2018  . Testosterone  Total,Free,Bio, Males    Standing Status:   Future    Standing Expiration Date:   02/28/2018  . POCT Urinalysis Dipstick (Automated)    Standing Status:   Future    Standing Expiration Date:   03/30/2017   Return precautions advised.  Tana ConchStephen Hunter, MD

## 2017-03-15 ENCOUNTER — Other Ambulatory Visit: Payer: BLUE CROSS/BLUE SHIELD

## 2017-05-10 DIAGNOSIS — F900 Attention-deficit hyperactivity disorder, predominantly inattentive type: Secondary | ICD-10-CM | POA: Diagnosis not present

## 2017-11-01 DIAGNOSIS — F900 Attention-deficit hyperactivity disorder, predominantly inattentive type: Secondary | ICD-10-CM | POA: Diagnosis not present

## 2018-01-02 IMAGING — CT CT ABD-PELV W/ CM
2 of 5 series · 16 of 46 positions shown, 18 images · IV contrast (ISOVUE)
Comparison: CT abdomen pelvis 02/07/2010

CLINICAL DATA: Epigastric pain, hit Cox, constipation

EXAM:
CT ABDOMEN AND PELVIS WITH CONTRAST
TECHNIQUE: Multidetector CT imaging of the abdomen and pelvis was performed
using the standard protocol following bolus administration of
intravenous contrast.
CONTRAST:  100mL 3MW3YR-PII IOPAMIDOL (3MW3YR-PII) INJECTION 61%

[Series 2: abd/pel with · axial · 0.74mm/px · z∈[-852,-442]mm · 13 of 94 slices shown, 15 images]
[im 6/94  soft-tissue]
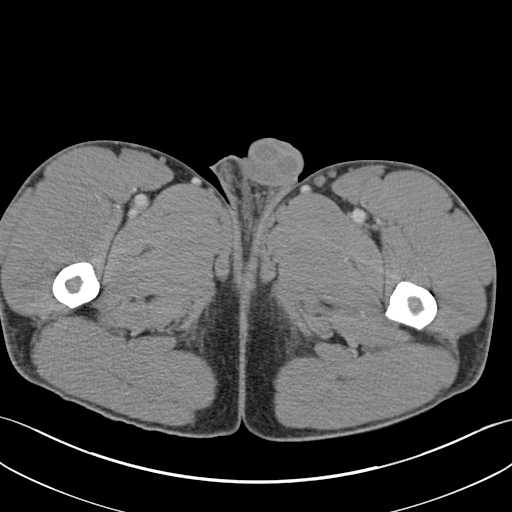
[im 6/94  bone]
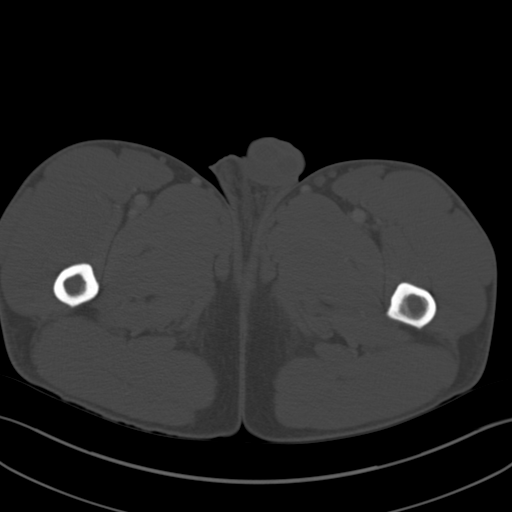
[im 11/94  soft-tissue]
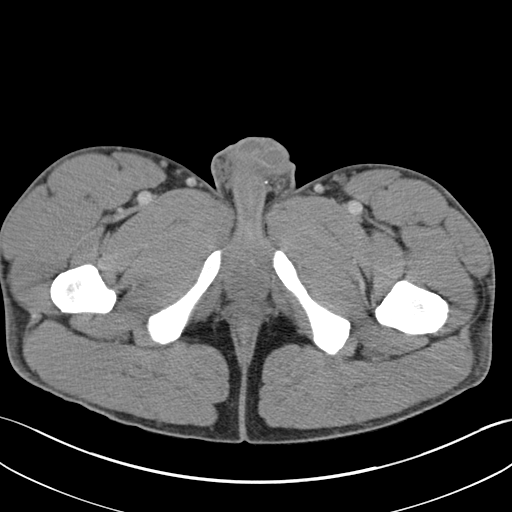
[im 22/94  soft-tissue]
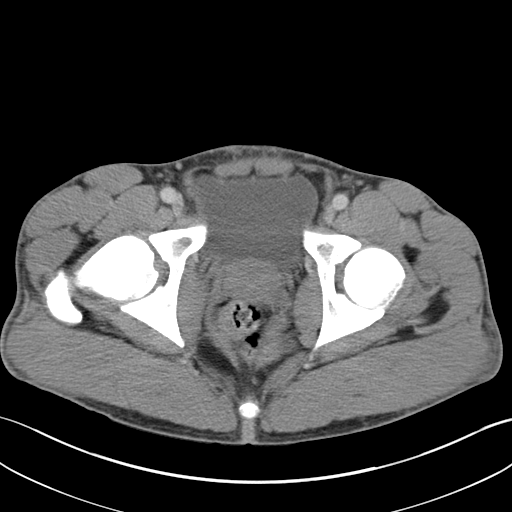
[im 28/94  soft-tissue]
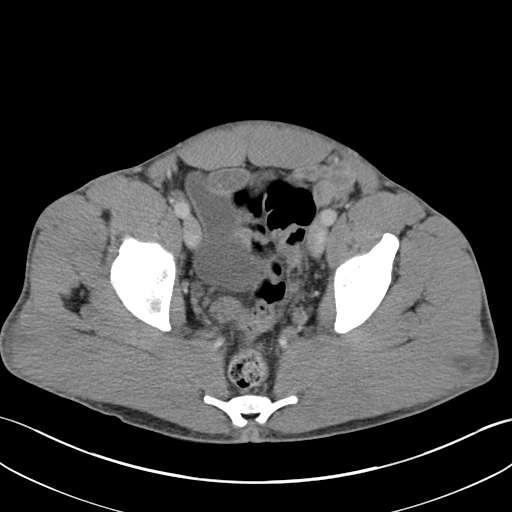
[im 33/94  soft-tissue]
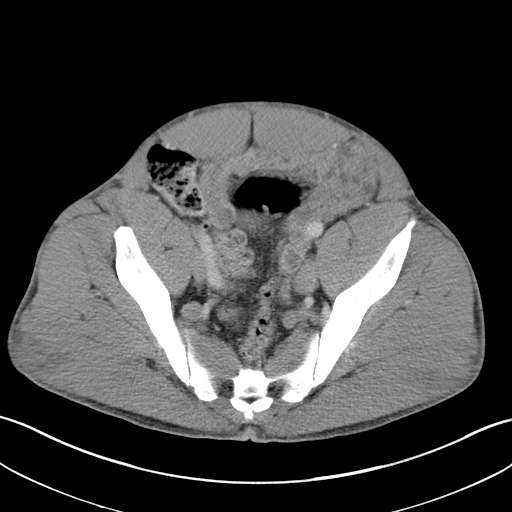
[im 39/94  soft-tissue]
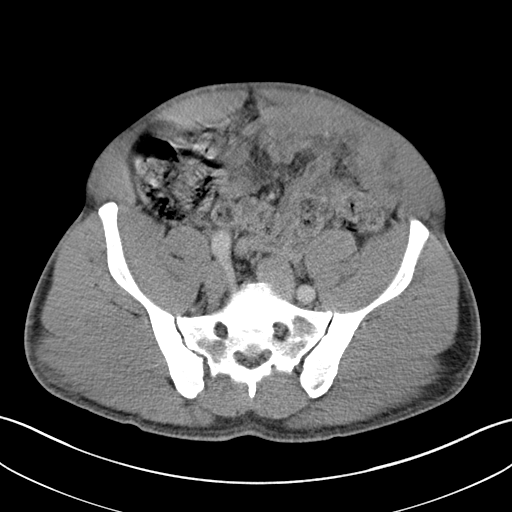
[im 50/94  soft-tissue]
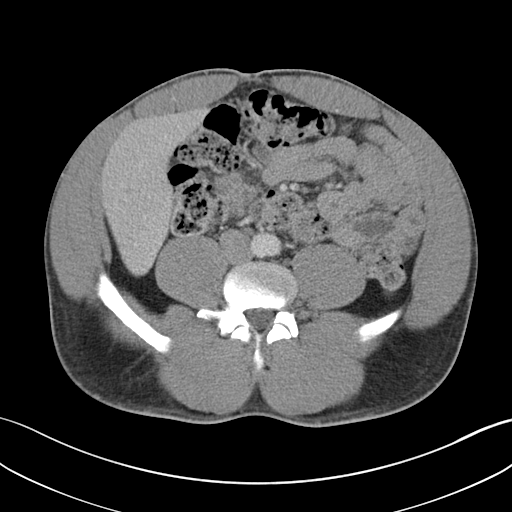
[im 55/94  soft-tissue]
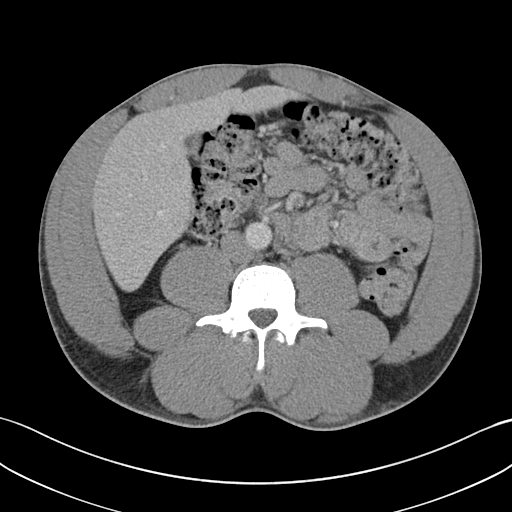
[im 61/94  soft-tissue]
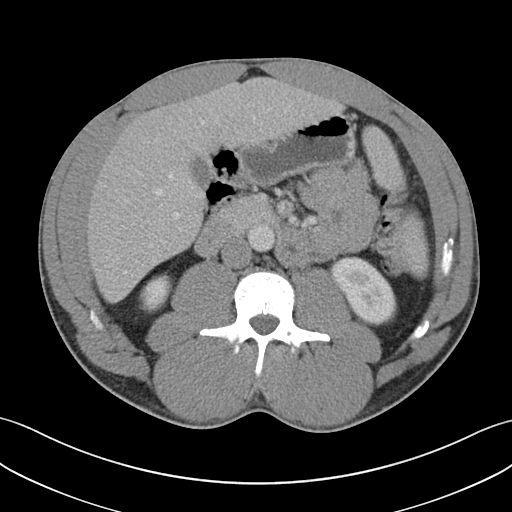
[im 61/94  bone]
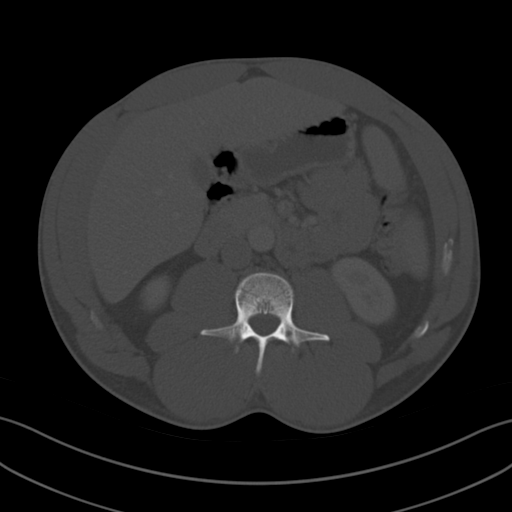
[im 66/94  soft-tissue]
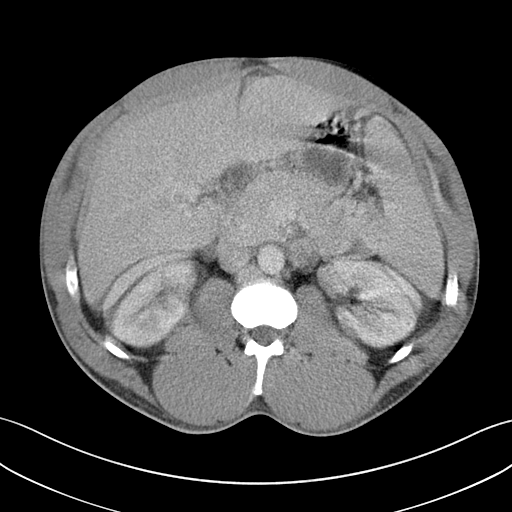
[im 72/94  soft-tissue]
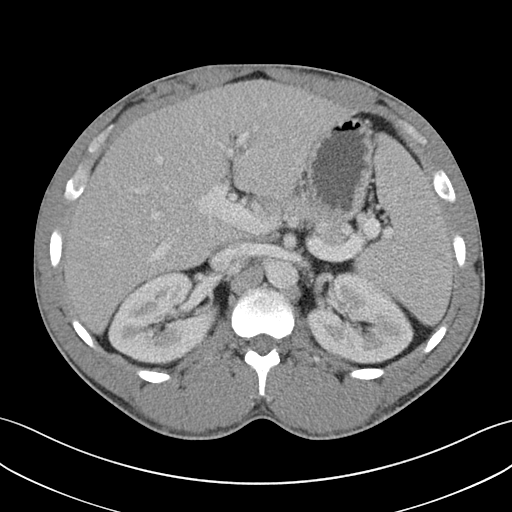
[im 83/94  soft-tissue]
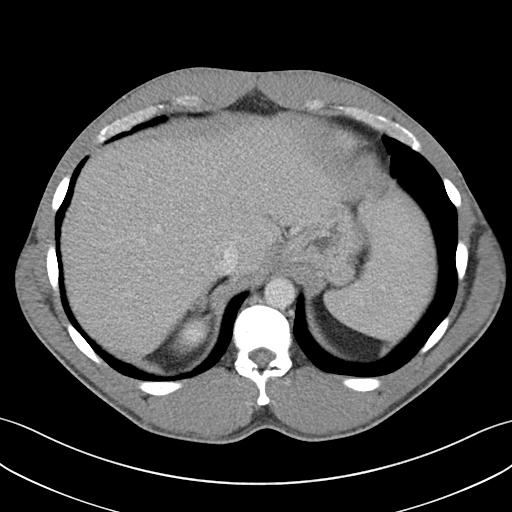
[im 88/94  soft-tissue]
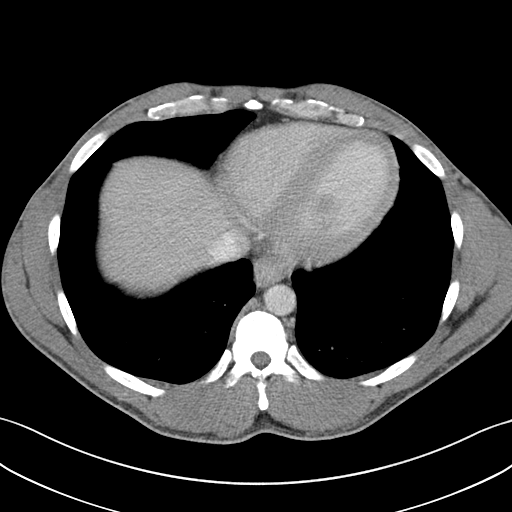

[Series 4: coronal a/|p · coronal · 0.74mm/px · 3 of 176 slices shown]
[im 59/176  soft-tissue]
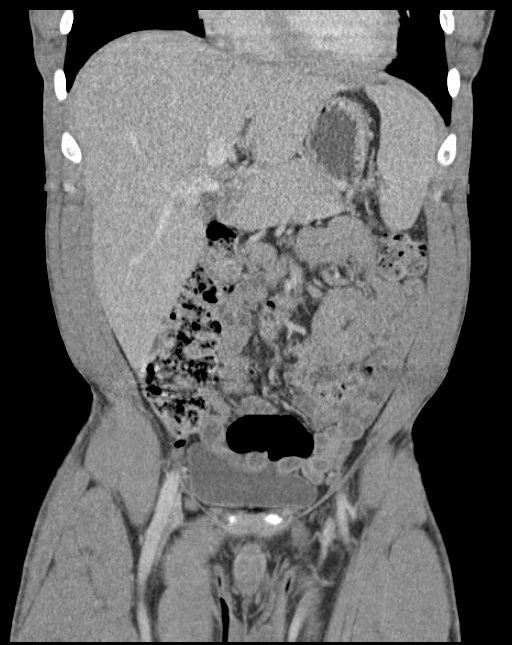
[im 78/176  soft-tissue]
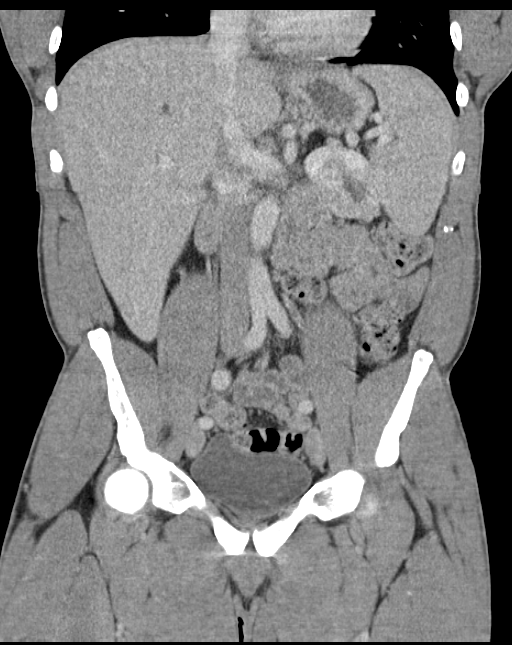
[im 98/176  soft-tissue]
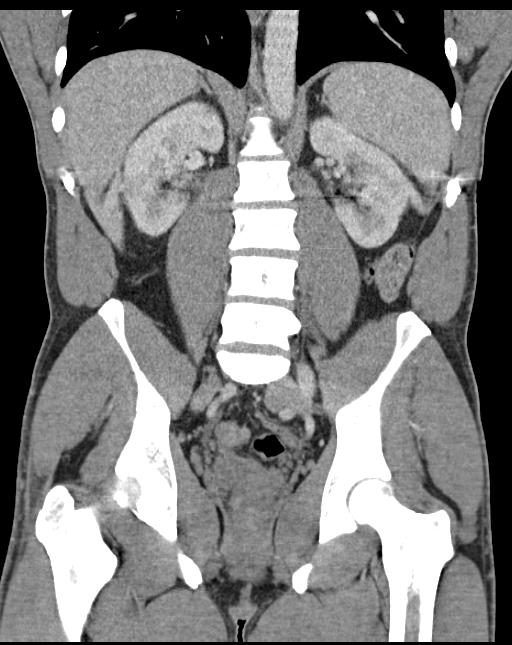

[16 of 46 positions shown; findings below may reference images not displayed]

FINDINGS: Lower chest: No pulmonary nodules. No visible pleural or pericardial
effusion.

Hepatobiliary: Small hypodensity in the right hepatic lobe, favored
to be hepatic cyst or biliary hamartomas. No other focal liver
lesion. No ascites. No biliary dilatation. Normal gallbladder.

Pancreas: Normal pancreatic contours and enhancement. No
peripancreatic fluid collection or pancreatic ductal dilatation.

Spleen: Normal.

Adrenals/Urinary Tract: Normal adrenal glands. No hydronephrosis or
solid renal mass.

Stomach/Bowel: No abnormal bowel dilatation. No bowel wall
thickening or adjacent fat stranding to indicate acute inflammation.
No abdominal fluid collection. Normal appendix.

Vascular/Lymphatic: Normal course and caliber of the major abdominal
vessels. No abdominal or pelvic adenopathy.

Reproductive: Normal prostate and seminal vesicles.

Musculoskeletal: No lytic or blastic osseous lesion. Normal
visualized extrathoracic and extraperitoneal soft tissues.

Other: No contributory non-categorized findings.
IMPRESSION: No acute abnormality of the abdomen or pelvis.

## 2018-03-06 NOTE — Progress Notes (Signed)
Left voicemail for patient to call back and schedule physical

## 2018-11-28 ENCOUNTER — Encounter: Payer: Self-pay | Admitting: Family Medicine

## 2020-09-28 ENCOUNTER — Encounter: Payer: Self-pay | Admitting: Family

## 2020-09-28 ENCOUNTER — Other Ambulatory Visit: Payer: Self-pay

## 2020-09-28 ENCOUNTER — Ambulatory Visit (INDEPENDENT_AMBULATORY_CARE_PROVIDER_SITE_OTHER): Payer: Self-pay | Admitting: Family

## 2020-09-28 VITALS — BP 142/97 | HR 83 | Temp 97.9°F | Ht 69.5 in | Wt 182.8 lb

## 2020-09-28 DIAGNOSIS — F900 Attention-deficit hyperactivity disorder, predominantly inattentive type: Secondary | ICD-10-CM

## 2020-09-28 DIAGNOSIS — F321 Major depressive disorder, single episode, moderate: Secondary | ICD-10-CM

## 2020-09-28 MED ORDER — CITALOPRAM HYDROBROMIDE 20 MG PO TABS: 20.0000 mg | ORAL_TABLET | Freq: Every day | ORAL | 3 refills | Status: DC

## 2020-09-29 NOTE — Progress Notes (Signed)
Acute Office Visit  Subjective:    Patient ID: Darren Freeman, male    DOB: 1980/07/29, 40 y.o.   MRN: [id number]  Chief Complaint  Patient presents with  . Depression    Pt says that he self d/c his ADD medication and would like to go back on.    HPI Patient is in today with concerns of worsening depression and apathy after his wife decided in March that she no longer wanted to be married. Patient feels he has a great marriage and was caught off-guard by the news. He also says that for the last 1.5 years he has lacked motivation to find additional employment which may have been a factor in the stress on his marriage. 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 He reports knowing he needs to find better employment bus has not. Works at a Building services engineer. Has 2 children. Living with his father now, which makes him feel less than a man. Has feelings of helplessness and hopelessness but denies any thoughts of death or dying. He came off of meds in the past because he did not want to take the, anymore  Has taken Celexa in the past that worked well. Took Adderall in the past as well that worked to help with ADD.   Past Medical History:  Diagnosis Date  . ADD (attention deficit disorder)   . Anxiety   . Asthma    ? allergy related. can run and no issues. sometimes wheeze or SOB after lifting or sex  . Depression   . Elevated LFTs   . GERD (gastroesophageal reflux disease)   . Glucose intolerance (impaired glucose tolerance)    previously on metformin  . HTN (hypertension)   . Hypogonadism male   . Low testosterone   . Vitamin D deficiency     Past Surgical History:  Procedure Laterality Date  . penile surgery as child- thin urethra      Family History  Problem Relation Age of Onset  . Breast cancer Paternal Grandmother   . Depression Mother        behavioral health admissions 3-4x  . Hyperlipidemia Father   . Hypertension  Father   . Depression Sister   . Depression Brother   . Diabetes Paternal Grandfather   . Colon cancer Neg Hx     Social History   Socioeconomic History  . Marital status: Single    Spouse name: Not on file  . Number of children: 0  . Years of education: Not on file  . Highest education level: Not on file  Occupational History  . Occupation: Tour manager: [employer]  Tobacco Use  . Smoking status: Former    Pack years: 0.00    Types: Cigarettes    Quit date: 07/30/2001    Years since quitting: 19.1  . Smokeless tobacco: Never  Substance and Sexual Activity  . Alcohol use: No  . Drug use: Yes    Types: Marijuana    Comment: previously used cocaine (quit x 2 years)  . Sexual activity: Yes  Other Topics Concern  . Not on file  Social History Narrative   Lives in [place] with fiancee. Getting married November 2018. 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 Ascorbic Acid (VITAMIN C) 1000 MG tablet Take 1,000 mg by mouth daily.     . Multiple Vitamin (MULTIVITAMIN) tablet Take 1 tablet by mouth daily.    Marland Kitchen albuterol (PROVENTIL HFA;VENTOLIN HFA) 108 (90 Base) MCG/ACT inhaler Inhale 2 puffs into the lungs every 6 (six) hours as needed for wheezing or shortness of breath. 1 Inhaler 5  . citalopram (CELEXA) 10 MG tablet Take 10 mg by mouth every other day.    . trazodone (DESYREL) 300 MG tablet Take 100 mg  by mouth every other day.      No facility-administered medications prior to visit.    No Known Allergies  Review of Systems  Psychiatric/Behavioral:  Positive for agitation and decreased concentration. Negative for self-injury and suicidal ideas. The patient is nervous/anxious.   All other systems reviewed and are negative.     Objective:    Physical Exam Vitals and nursing note reviewed.  Constitutional:      Appearance: Normal appearance.  Cardiovascular:     Rate and Rhythm: Normal rate and regular rhythm.     Pulses: Normal pulses.     Heart sounds: Normal heart sounds.  Pulmonary:     Effort: Pulmonary effort is normal.     Breath sounds: Normal breath sounds.  Abdominal:     General: Abdomen is flat.     Palpations: Abdomen is soft.  Musculoskeletal:        General: Normal range of motion.     Cervical back: Normal range of motion and neck supple.  Skin:    General: Skin is warm and dry.  Neurological:     General: No focal deficit present.     Mental Status: He is alert and oriented to person, place, and time.  Psychiatric:        Mood and Affect: Mood normal.        Behavior: Behavior normal.   BP (!) 142/97   Pulse 83   Temp 97.9 F (36.6 C) (Temporal)   Ht 5' 9.5" (1.765 m)   Wt 182 lb 12.8 oz (82.9 kg)   SpO2 97%   BMI 26.61 kg/m  Wt Readings from Last 3 Encounters:  09/28/20 182 lb 12.8 oz (82.9 kg)  02/28/17 192 lb 6.4 oz (87.3 kg)  11/21/16 200 lb 1.6 oz (90.8 kg)    There are no preventive care reminders to display for this patient.  There are no preventive care reminders to display for this patient.   Lab Results  Component Value Date   TSH 1.085 04/22/2013   Lab Results  Component Value Date   WBC 7.4 01/24/2016   HGB 14.9 01/24/2016   HCT 43.5 01/24/2016   MCV 86.8 01/24/2016   PLT 293.0 01/24/2016   Lab Results  Component Value Date   NA 136 01/24/2016   K 4.2 01/24/2016   CO2 24 01/24/2016   GLUCOSE 86 01/24/2016   BUN  20 01/24/2016   CREATININE 1.22 01/24/2016   BILITOT 0.4 01/24/2016   ALKPHOS 32 (L) 01/24/2016  AST 68 (H) 01/24/2016   ALT 54 (H) 01/24/2016   PROT 7.3 01/24/2016   ALBUMIN 4.0 01/24/2016   CALCIUM 8.8 01/24/2016   ANIONGAP 10 12/23/2015   GFR 71.43 01/24/2016   Lab Results  Component Value Date   CHOL 161 01/24/2016   Lab Results  Component Value Date   HDL 27.80 (L) 01/24/2016   Lab Results  Component Value Date   LDLCALC 111 (H) 01/24/2016   Lab Results  Component Value Date   TRIG 108.0 01/24/2016   Lab Results  Component Value Date   CHOLHDL 6 01/24/2016   Lab Results  Component Value Date   HGBA1C 5.5 04/22/2013       Assessment & Plan:   Problem List Items Addressed This Visit     Depression - Primary   Relevant Medications   citalopram (CELEXA) 20 MG tablet   ADD (attention deficit disorder)     Meds ordered this encounter  Medications  . citalopram (CELEXA) 20 MG tablet    Sig: Take 1 tablet (20 mg total) by mouth daily.    Dispense:  30 tablet    Refill:  3   Patient will start Celexa today. Will consider re-starting Adderall in 3 weeks after we know he is stable on Celexa. See therapist. Increase cardiovascular exercise.   Eulis Foster, FNP

## 2020-10-07 ENCOUNTER — Ambulatory Visit: Payer: BLUE CROSS/BLUE SHIELD | Admitting: Family Medicine

## 2020-10-20 ENCOUNTER — Other Ambulatory Visit: Payer: Self-pay | Admitting: Family

## 2020-11-23 ENCOUNTER — Ambulatory Visit: Payer: Medicaid Other | Admitting: Family Medicine

## 2020-11-23 NOTE — Progress Notes (Incomplete)
Phone [phone] In person visit   Subjective:   Darren Freeman is a 40 y.o. year old very pleasant male patient who presents for/with See problem oriented charting No chief complaint on file.   This visit occurred during the SARS-CoV-2 public health emergency.  Safety protocols were in place, including screening questions prior to the visit, additional usage of staff PPE, and extensive cleaning of exam room while observing appropriate contact time as indicated for disinfecting solutions.   Past Medical History-  Patient Active Problem List   Diagnosis Date Noted   Elevated transaminase level 01/22/2016   GERD (gastroesophageal reflux disease) 01/21/2016   Intractable hiccups 01/21/2016   Depression 01/21/2016   Insomnia 01/21/2016   Asthma    Hyperlipidemia 08/18/2013   Essential hypertension 08/18/2013   Abnormal glucose 08/18/2013   Vitamin D deficiency    ADD (attention deficit disorder)    Testosterone Deficiency 09/06/2011    Medications- reviewed and updated Current Outpatient Medications  Medication Sig Dispense Refill   Ascorbic Acid (VITAMIN C) 1000 MG tablet Take 1,000 mg by mouth daily.      citalopram (CELEXA) 20 MG tablet Take 1 tablet (20 mg total) by mouth daily. 30 tablet 3   Multiple Vitamin (MULTIVITAMIN) tablet Take 1 tablet by mouth daily.     No current facility-administered medications for this visit.     Objective:  There were no vitals taken for this visit. Gen: NAD, resting comfortably CV: RRR no murmurs rubs or gallops Lungs: CTAB no crackles, wheeze, rhonchi Abdomen: soft/nontender/nondistended/normal bowel sounds. No rebound or guarding.  Ext: no edema Skin: warm, dry Neuro: grossly normal, moves all extremities  ***    Assessment and Plan  # Depression/ ADD S: Medication:Celexa 20 mg - Patient seen FNP Worthy Rancher 09/28/2020 for worsening depression and apathy -  wife had decided in March to get a divorce - he felt that  the marriage was going great and this news took him by surprise.  - Patient also lacked motivation to find additional employment for the last 1.5 years -felt this might have been a contributor to the end of his marriage.  - He also lived with his father during this time which made him feel less like a man - feelings of helplessness and hopelessness but denied thoughts of death or dying. He did stop taking his medications because he did not want to any more - FNP Hyman Hopes restarted him on Celexa 20 mg daily again - this worked for him in the past. He was also on Adderall in the past- but she opted for him take this 3 weeks after seeing if he is stable on Celexa *** - Patient was advised to see a therapist and to increase cardiovascular exercise *** Depression screen [facility] 2/9 09/28/2020  Decreased Interest 3  Down, Depressed, Hopeless 3  PHQ - 2 Score 6  Altered sleeping 3  Tired, decreased energy 3  Change in appetite 1  Feeling bad or failure about yourself  3  Trouble concentrating 2  Moving slowly or fidgety/restless 1  Suicidal thoughts 1  PHQ-9 Score 20  Difficult doing work/chores Somewhat difficult   A/P: ***  #hypertension S: medication: none - controlled without no meds - discussed for blood pressure goal to be <140/90 -thought to be from being off adderall in the past  Home readings #s: *** BP Readings from Last 3 Encounters:  09/28/20 (!) 142/97  02/28/17 122/84  11/21/16 110/80  A/P: ***  .# Testosterone  Deficiency S:in the past he was getting 250 mg of sustanon into his glutes in the past 3x a week - he then transitioned to enanthate 3 x a week. - Stated when on rx through doctor had been as high as 400 mg every 2 weeks.   - without meds he would feel a depressed mood and low energy - testosterone was above goal in 2017 and I advised patient to stop/cut back Lab Results  Component Value Date   TESTOSTERONE 946.10 (H) 01/24/2016  A/P: ***    Recommended follow up: No  follow-ups on file. Future Appointments  Date Time Provider Department Center  11/23/2020  4:20 PM Shelva Majestic, MD [facility]    Lab/Order associations: No diagnosis found.  No orders of the defined types were placed in this encounter.  I,Harris Phan,acting as a Neurosurgeon for Tana Conch, MD.,have documented all relevant documentation on the behalf of Tana Conch, MD,as directed by  Tana Conch, MD while in the presence of Tana Conch, MD.    ***  Return precautions advised.  Rich Number

## 2020-12-21 DIAGNOSIS — Z85828 Personal history of other malignant neoplasm of skin: Secondary | ICD-10-CM | POA: Diagnosis not present

## 2020-12-21 DIAGNOSIS — C44319 Basal cell carcinoma of skin of other parts of face: Secondary | ICD-10-CM | POA: Diagnosis not present

## 2021-01-22 ENCOUNTER — Other Ambulatory Visit: Payer: Self-pay | Admitting: Family

## 2021-02-07 DIAGNOSIS — E162 Hypoglycemia, unspecified: Secondary | ICD-10-CM | POA: Diagnosis not present

## 2021-02-07 DIAGNOSIS — I499 Cardiac arrhythmia, unspecified: Secondary | ICD-10-CM | POA: Diagnosis not present

## 2021-02-07 DIAGNOSIS — Z743 Need for continuous supervision: Secondary | ICD-10-CM | POA: Diagnosis not present

## 2021-02-07 DIAGNOSIS — R404 Transient alteration of awareness: Secondary | ICD-10-CM | POA: Diagnosis not present

## 2021-02-07 DIAGNOSIS — E161 Other hypoglycemia: Secondary | ICD-10-CM | POA: Diagnosis not present

## 2021-02-08 NOTE — Progress Notes (Signed)
Phone [phone] In person visit   Subjective:   Darren Freeman is a 40 y.o. year old very pleasant male patient who presents for/with See problem oriented charting Chief Complaint  Patient presents with   Hospitalization [facility] f/u from ED 02/07/21, falling hitting a cement wall while doing physical training.  C/o having pain in the Rt chest area.      This visit occurred during the SARS-CoV-2 public health emergency.  Safety protocols were in place, including screening questions prior to the visit, additional usage of staff PPE, and extensive cleaning of exam room while observing appropriate contact time as indicated for disinfecting solutions.   Past Medical History-  Patient Active Problem List   Diagnosis Date Noted   Testosterone Deficiency 09/06/2011    Priority: High   Elevated transaminase level 01/22/2016    Priority: Medium    Intractable hiccups 01/21/2016    Priority: Medium    Depression 01/21/2016    Priority: Medium    Asthma     Priority: Medium    Hyperlipidemia 08/18/2013    Priority: Medium    Essential hypertension 08/18/2013    Priority: Medium    ADD (attention deficit disorder)     Priority: Medium    GERD (gastroesophageal reflux disease) 01/21/2016    Priority: Low   Insomnia 01/21/2016    Priority: Low   Abnormal glucose 08/18/2013    Priority: Low   Vitamin D deficiency     Priority: Low    Medications- reviewed and updated Current Outpatient Medications  Medication Sig Dispense Refill   Ascorbic Acid (VITAMIN C) 1000 MG tablet Take 1,000 mg by mouth daily.      citalopram (CELEXA) 20 MG tablet Take 1 tablet (20 mg total) by mouth daily. 30 tablet 3   levETIRAcetam (KEPPRA) 500 MG tablet Take by mouth.     Multiple Vitamin (MULTIVITAMIN) tablet Take 1 tablet by mouth daily.     ALPRAZolam (XANAX) 1 MG tablet Take 1 mg by mouth 3 (three) times daily as needed.     amphetamine-dextroamphetamine (ADDERALL) 30 MG  tablet Take 1 tablet by mouth 3 (three) times daily.     No current facility-administered medications for this visit.     Objective:  BP 138/86   Pulse 86   Temp 99.1 F (37.3 C) (Temporal)   Ht 5' 9.5" (1.765 m)   Wt 189 lb 12.8 oz (86.1 kg)   SpO2 98%   BMI 27.63 kg/m  Gen: NAD, resting comfortably CV: RRR no murmurs rubs or gallops Lungs: CTAB no crackles, wheeze, rhonchi Mild right chest wall pain with palpation Ext: no edema Skin: warm, dry  4 sutures noted on left hand-slightly macerated area but dressing has not been changed since emergency room visit on 10/31  Forehead laceration with no surrounding erythema    Assessment and Plan   # ED visit form falling down the stairs S: Seen at [facility] emergency department on 02/07/21 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  Denied numbness/tingling.  -GCS 14,  LOC  with EMS. Injuries significant for left hand laceration, right non-displaced fracture of the right C7 transverse process, and small SAH left frontal lobe. Orthopaedics spine consulted for evaluation/management of his right C7 TP fracture.--Patient's c-collar cleared in ED in setting of  no TTP about the C-spine and full painless cervical spine ROM. No orthopaedic spine surgical intervention indicated for right C7 TP  nonisplacedfracture- advised soft brace as needed for comfort - repeat head CT before discharge 6 hours after initial-without worsening . Keppra given for 7 days 548m BID and advised no anticoagulants. Plan was for PCP follow up and repeat CT head  -Found to have a small bleed in your brain that was stable. Neurosurgery cleared you for discharge.Soft neck brace for comfort if needed  - no seizure like activity. Encouraged him to take 7 days of keppra (missed a few doses)  No headaches.  Has not even needed tylenol. They gave him a staple removal kit but did not give timing for this to be removed. No significant confusion. Worst he has noted is thinking different day of week than it is- but off y 1 day  Patient was told had a fracture in the left hand-concerning as this was over his left hand where he also had the laceration-the only information I can find from the chart states "Soft tissue: Punctate radiopaque densities along the superficial soft tissues of the ulnar aspect of the index finger, possibly external to patient; correlate with physical exam." -May not have been a fracture-based off of discharge paperwork found after patient left the building today "You can take off your wrist splint to shower. You only need to wear it so long as you are having pain.  Electronically signed by LKirke Shaggy MD at 02/07/2021 4:07 PM EDT"-appears much less likely to be an open fracture A/P: In regards to possible left hand fracture and transverse process C7 fracture-we will refer to sports medicine for their expert opinion.  He reports having a sensation of a "crick" in the neck but this has resolved-essentially no pain.  He was released without even a soft collar-was only to use if needed for comfort.  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  Hard to take deep breath. Opts out of repeat x-ray at this time unless symptoms worsen  In regards to subarachnoid hemorrhage left frontal lobe-I encouraged the patient to take his Keppra twice daily for full 7 days as recommended.  CT of the head on repeat in emergency room actually slightly improved-he reports a recommended to have 2-week follow-up head CT though I do not see this specifically noted in discharge summary no I do think it is reasonable to recheck head CT at follow-up visit -Discussed return precautions particular headache-also blurry vision, weakness would be recommended follow-up - I do think he can return to classwork only for ENT-we will write letter  # Depression S: Medication:Celexa 20 mg daily [facility] 02/09/2021 09/28/2020  Decreased Interest 0 3  Down, Depressed, Hopeless 0 3  PHQ - 2 Score 0 6  Altered sleeping 0  3  Tired, decreased energy 0 3  Change in appetite 0 1  Feeling bad or failure about yourself  0 3  Trouble concentrating 1 2  Moving slowly or fidgety/restless 1 1  Suicidal thoughts 0 1  PHQ-9 Score 2 20  Difficult doing work/chores Somewhat difficult Somewhat difficult   A/P: Depression appears well controlled despite recent injury-continue current medications and monitoring  Recommended follow up: No follow-ups on file. Future Appointments  Date Time Provider [place]  02/17/2021 10:00 AM Marin Olp, MD [facility]   Lab/Order associations:   ICD-10-CM   1. Closed fracture of transverse process of cervical vertebra, initial encounter ([place])  S12.9XXA Ambulatory referral to Sports Medicine    CANCELED: Ambulatory referral to Sports Medicine    2. Left hand pain  M79.642     3. Falling, in, on, stairs, initial encounter  W10.9XXA     4. Major depressive disorder with single episode, in full remission ([place])  F32.5       No orders of the defined types were placed in this encounter.  Time Spent: 43 minutes  of total time (11:51 PM- 12:34 PM-additional time spent documenting in the evening hours or not documented) was spent on the date of the encounter performing the following actions: chart review prior to seeing the patient, obtaining history, performing a medically necessary exam, counseling on the treatment plan, placing orders, and documenting in our EHR.  Extra time was required for record review through [place] as a scribe for Garret Reddish, MD.,have documented all relevant documentation on the behalf of Garret Reddish, MD,as directed by  Garret Reddish, MD while in the presence of Garret Reddish, MD.   I, Garret Reddish, MD, have reviewed all documentation for this visit. The documentation on 02/09/21 for the exam, diagnosis, procedures, and orders are all accurate and complete.   Return precautions advised.  Garret Reddish, MD

## 2021-02-09 ENCOUNTER — Ambulatory Visit (INDEPENDENT_AMBULATORY_CARE_PROVIDER_SITE_OTHER): Payer: 59 | Admitting: Family Medicine

## 2021-02-09 ENCOUNTER — Encounter: Payer: Self-pay | Admitting: Family Medicine

## 2021-02-09 ENCOUNTER — Other Ambulatory Visit: Payer: Self-pay

## 2021-02-09 VITALS — BP 138/86 | HR 86 | Temp 99.1°F | Ht 69.5 in | Wt 189.8 lb

## 2021-02-09 DIAGNOSIS — M79642 Pain in left hand: Secondary | ICD-10-CM | POA: Diagnosis not present

## 2021-02-09 DIAGNOSIS — W109XXA Fall (on) (from) unspecified stairs and steps, initial encounter: Secondary | ICD-10-CM | POA: Diagnosis not present

## 2021-02-09 DIAGNOSIS — S129XXA Fracture of neck, unspecified, initial encounter: Secondary | ICD-10-CM

## 2021-02-09 DIAGNOSIS — Z23 Encounter for immunization: Secondary | ICD-10-CM

## 2021-02-09 DIAGNOSIS — F325 Major depressive disorder, single episode, in full remission: Secondary | ICD-10-CM | POA: Diagnosis not present

## 2021-02-09 DIAGNOSIS — R69 Illness, unspecified: Secondary | ICD-10-CM | POA: Diagnosis not present

## 2021-02-09 DIAGNOSIS — F321 Major depressive disorder, single episode, moderate: Secondary | ICD-10-CM

## 2021-02-09 NOTE — Patient Instructions (Addendum)
Health Maintenance Due  Topic Date Due   INFLUENZA VACCINE - team please log date of his flu shot 11/08/2020   Tearm rewrap hand and have him apply brace -urgent referral to sports medicine- please speak to referral coordinator  If you have any continued deep pain with chewing- let me know and will order ct maxillofacial   If any headaches or worsening symptoms let us know ASAP  Im ok with scalp getting wet but lets hold on hand until we get sports medicine opinion  If any shortness of breath- lets repeat chest x -ray  Team recheck PHQ9  Recommended follow up: Schedule on Thursday November 10th for same day slot- need follow up to remove sutures and reassess.

## 2021-02-11 ENCOUNTER — Encounter: Payer: Self-pay | Admitting: Family Medicine

## 2021-02-16 ENCOUNTER — Ambulatory Visit (INDEPENDENT_AMBULATORY_CARE_PROVIDER_SITE_OTHER): Payer: 59 | Admitting: Family Medicine

## 2021-02-16 ENCOUNTER — Ambulatory Visit (INDEPENDENT_AMBULATORY_CARE_PROVIDER_SITE_OTHER): Payer: 59

## 2021-02-16 ENCOUNTER — Other Ambulatory Visit: Payer: Self-pay

## 2021-02-16 VITALS — BP 130/88 | HR 94 | Ht 69.5 in | Wt 193.8 lb

## 2021-02-16 DIAGNOSIS — R0789 Other chest pain: Secondary | ICD-10-CM | POA: Diagnosis not present

## 2021-02-16 DIAGNOSIS — R079 Chest pain, unspecified: Secondary | ICD-10-CM | POA: Diagnosis not present

## 2021-02-16 DIAGNOSIS — S129XXA Fracture of neck, unspecified, initial encounter: Secondary | ICD-10-CM

## 2021-02-16 DIAGNOSIS — S066X0A Traumatic subarachnoid hemorrhage without loss of consciousness, initial encounter: Secondary | ICD-10-CM | POA: Diagnosis not present

## 2021-02-16 MED ORDER — TIZANIDINE HCL 4 MG PO TABS: 4.0000 mg | ORAL_TABLET | Freq: Four times a day (QID) | ORAL | 1 refills | Status: DC | PRN

## 2021-02-16 NOTE — Progress Notes (Signed)
I, Philbert Riser, LAT, ATC acting as a scribe for Clementeen Graham, MD.  Subjective:    CC: C7 transverse process fx and L hand pain  HPI: Pt is a 40 y/o male presenting w/ a C7 transverse process fx and L hand pain. 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 Pt was seen at the [facility] ED on 02/07/21 and received 4 sutures in L hand to repair wound and 8 staples to repair the scalp laceration. Additionally, pt was dx w/ a R non-displaced C7 transverse process fx and small SAH L frontal lobe. Today, pt reports his pain is R-sided chest, and R clavicle. Pt c/o increase pain when coughing, deep breathing, and trunk motions. Pt notes no pain in his L hand or wrist, but has been wearing a wrist splint. Pt reports a "knot" along the R medial border of the scapula  He has some pain in the anterior chest wall with arm motion and with pressure.  He thinks he may have broken a rib.  Chest x-ray in the emergency room was negative.  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   MSK:  C-spine: Normal-appearing Nontender midline.  Normal cervical motion. Upper extremity strength and reflexes and sensation are equal and intact throughout.  Left hand and wrist well-appearing abrasion to laceration palmar second MCP with well-appearing sutures.  Minimally tender.  Normal hand motion and strength.  Wrist is nontender.  Strength is intact.  Right chest wall.  Tender palpation right anterior superior chest wall.  Neuro: Alert and  oriented normal coordination balance and gait.  Speech and thought process and affect are normal.  Lab and Radiology Results   X-ray images right ribs and chest obtained today personally and independently interpreted No clear obvious displaced rib fracture present today. Await formal radiology review  INDICATION: Subarachnoid hemorrhage [facility]) suspected, repeat CT 4 hours  after initial at 2pm, W10.8XXA Fall (on) (from) other stairs and steps,  initial encounter   COMPARISON: Noncontrast CT brain same day.   TECHNIQUE: Standard noncontrast brain CT.   FINDINGS:  Brain Parenchyma: There is no acute cortical infarct, mass or mass effect.  Redemonstrated small amount of subarachnoid hemorrhage overlying the left  frontal convexity, which is slightly decreased in conspicuity compared to  prior. No new hemorrhage identified.  Ventricles and Sulci: Normal for age. No hydrocephalus.  Extra-Axial Spaces: No extra-axial fluid collection.  Basal Cisterns: Normal.   Paranasal Sinuses: Normal.  Mastoid air cells: Normal.   Orbits: Normal.  Cranium and Bones: Normal.  Soft Tissues: Right temporal scalp contusion and associated laceration.   IMPRESSION:   Decreased conspicuity of trace subarachnoid hemorrhage overlying the left  frontal convexity.    Electronically Reviewed by:  Elmer Ramp, MD, [facility]  Electronically Reviewed on:  02/07/2021 2:26 PM   I have reviewed the images and concur with the above findings.   Electronically Signed by:  Consuelo Pandy, MD, [facility]  Electronically Signed on:  02/07/2021 2:59 PM  CT CERVICAL SPINE WITHOUT CONTRAST   INDICATION: Neck trauma, dangerous injury mechanism (Age 71-64y), W10.8XXA  Fall (on) (from) other stairs and steps, initial encounter   COMPARISON: None.   TECHNIQUE: Standard noncontrast cervical spine axial CT images obtained  with generation of coronal and sagittal reformats.   FINDINGS:  CERVICAL SPINE  FINDINGS:  Craniocervical Junction: No fracture  Atlantodental Interval: Normal   Alignment: No traumatic listhesis.  Vertebral Column: Acute fracture of the right transverse process of C7.  Questionable defect in the posterior tubercle of the right transverse  process of C6 on series 6 image 105 is likely a nutrient foramen.  Facet Joints: No traumatic facet dislocations.  Prevertebral Soft Tissues: No soft tissue swelling.  Surgical Changes: None  Degenerative Changes and Central Spinal Canal: No high grade spinal canal  stenosis.   Skull Base: The partially imaged skull base is unremarkable.  Lung Apices: The partially visualized lung apices are unremarkable.  Ribs: no fracture of the visualized ribs.  Regional Soft Tissues: The soft tissue contusive changes to the skin and  subcutaneous posteriorly, overlying the C6-T2 spinous processes.     IMPRESSION:  1.  Acute nondisplaced fracture of the right transverse process of C7.  2.  Contusive changes in the posterior subcutaneous fat overlying the lower  cervical spine.  3.  No malalignment, facet joint widening, or vertebral body height loss.  If there is concern for ligamentous injury, recommend MRI.     The pertinent findings of this examination discussed by telephone  with Dr.  Juel Burrow at 02/07/2021 10:27 AM    Procedure: XR CHEST PA AND LATERAL   Comparison: Chest radiograph 12/08/2020 and earlier.   Indication: Lung Aeration, W10.8XXA Fall (on) (from) other stairs and  steps, initial encounter   Findings:   Stable cardiomediastinal silhouette. No evidence of pleural effusion or  pneumothorax. Clear lungs.   Impression:   Clear lungs.    Electronically Reviewed by:  Carson Myrtle, MD, [facility]  Electronically Reviewed on:  02/07/2021 11:08 AM   I have reviewed the images and concur with the above findings.   Electronically Signed by:  Veda Canning, MD, [facility]  Electronically Signed on:   02/07/2021 2:13 PM  Impression and Recommendations:    Assessment and Plan: 40 y.o. 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 Fortunately despite the subarachnoid hematoma and the C7 transverse fracture seen on CT scan at [facility] ER he does not have a serious life-threatening injury.  The subarachnoid hematoma was resolving on repeat CT scan as above and he is feeling well with no significant neurologic symptoms. The C7 transverse fracture is minimally symptomatic and he has been cleared to return to normal activity per spine surgery in the ER which I agree with.  He does have some anterior chest wall pain and I am concerned he probably has an occult rib fracture that I cannot see on x-ray today.  However radiology overread is still pending.  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  He notes that for the next 2 weeks it is more bookwork and sit down in light activity and then starting in about 2 weeks he will be doing more heavy duty activity.  I think it is okay to do light activity and then check back.  If he is feeling well I think it is okay to do some heavier  duty activity at that point.  Recommend tizanidine at bedtime as needed along with ibuprofen and Tylenol.  PDMP not reviewed this encounter. Orders Placed This Encounter  Procedures   DG Ribs Unilateral W/Chest Right    Standing Status:   Future    Number of Occurrences:   1    Standing Expiration Date:   02/16/2022    Order Specific Question:   Reason for Exam (SYMPTOM  OR DIAGNOSIS REQUIRED)    Answer:   eval rib fx r    Order Specific Question:   Preferred imaging location?    Answer:   [facility]   Meds ordered this encounter  Medications   tiZANidine (ZANAFLEX) 4 MG tablet    Sig: Take 1 tablet (4 mg total) by mouth every 6 (six) hours as needed for muscle spasms.    Dispense:  30 tablet    Refill:  1    Discussed warning  signs or symptoms. Please see discharge instructions. Patient expresses understanding.   The above documentation has been reviewed and is accurate and complete Clementeen Graham, M.D.   Total encounter time 45 minutes including face-to-face time with the patient and, reviewing past medical record, and charting on the date of service.   Extensive chart review.  Discussion treatment plan and option.

## 2021-02-16 NOTE — Patient Instructions (Addendum)
Thank you for coming in today.   Please get an Xray today before you leave   Heating pad will help.   Use the muscle relaxer at bedtime as needed.   Recheck in about 2 weeks.

## 2021-02-17 ENCOUNTER — Encounter: Payer: Self-pay | Admitting: Family Medicine

## 2021-02-17 ENCOUNTER — Ambulatory Visit (INDEPENDENT_AMBULATORY_CARE_PROVIDER_SITE_OTHER): Payer: 59 | Admitting: Family Medicine

## 2021-02-17 VITALS — BP 132/92 | HR 77 | Temp 98.8°F | Ht 69.5 in | Wt 198.2 lb

## 2021-02-17 DIAGNOSIS — I10 Essential (primary) hypertension: Secondary | ICD-10-CM

## 2021-02-17 DIAGNOSIS — F325 Major depressive disorder, single episode, in full remission: Secondary | ICD-10-CM | POA: Diagnosis not present

## 2021-02-17 DIAGNOSIS — E782 Mixed hyperlipidemia: Secondary | ICD-10-CM | POA: Diagnosis not present

## 2021-02-17 DIAGNOSIS — S129XXD Fracture of neck, unspecified, subsequent encounter: Secondary | ICD-10-CM

## 2021-02-17 DIAGNOSIS — S066X0D Traumatic subarachnoid hemorrhage without loss of consciousness, subsequent encounter: Secondary | ICD-10-CM

## 2021-02-17 DIAGNOSIS — E291 Testicular hypofunction: Secondary | ICD-10-CM | POA: Diagnosis not present

## 2021-02-17 DIAGNOSIS — R69 Illness, unspecified: Secondary | ICD-10-CM | POA: Diagnosis not present

## 2021-02-17 NOTE — Progress Notes (Signed)
Phone [phone] In person visit   Subjective:   Darren Freeman is a 40 y.o. year old very pleasant male patient who presents for/with See problem oriented charting Chief Complaint  Patient presents with   Suture / Staple Removal    This visit occurred during the SARS-CoV-2 public health emergency.  Safety protocols were in place, including screening questions prior to the visit, additional usage of staff PPE, and extensive cleaning of exam room while observing appropriate contact time as indicated for disinfecting solutions.   Past Medical History-  Patient Active Problem List   Diagnosis Date Noted   Testosterone Deficiency 09/06/2011    Priority: High   Elevated transaminase level 01/22/2016    Priority: Medium    Intractable hiccups 01/21/2016    Priority: Medium    Depression 01/21/2016    Priority: Medium    Asthma     Priority: Medium    Hyperlipidemia 08/18/2013    Priority: Medium    Essential hypertension 08/18/2013    Priority: Medium    ADD (attention deficit disorder)     Priority: Medium    GERD (gastroesophageal reflux disease) 01/21/2016    Priority: Low   Insomnia 01/21/2016    Priority: Low   Abnormal glucose 08/18/2013    Priority: Low   Vitamin D deficiency     Priority: Low   Subarachnoid hemorrhage following injury, no loss of consciousness (HCC) 02/16/2021   Closed fracture of transverse process of cervical vertebra (HCC) 02/16/2021    Medications- reviewed and updated Current Outpatient Medications  Medication Sig Dispense Refill   ALPRAZolam (XANAX) 1 MG tablet Take 1 mg by mouth 3 (three) times daily as needed.     amphetamine-dextroamphetamine (ADDERALL) 30 MG tablet Take 1 tablet by mouth 3 (three) times daily.     Ascorbic Acid (VITAMIN C) 1000 MG tablet Take 1,000 mg by mouth daily.      citalopram (CELEXA) 20 MG tablet Take 1 tablet (20 mg total) by mouth daily. 30 tablet 3   Multiple Vitamin (MULTIVITAMIN) tablet Take 1  tablet by mouth daily.     tiZANidine (ZANAFLEX) 4 MG tablet Take 1 tablet (4 mg total) by mouth every 6 (six) hours as needed for muscle spasms. 30 tablet 1   No current facility-administered medications for this visit.     Objective:  BP (!) 132/92   Pulse 77   Temp 98.8 F (37.1 C)   Ht 5' 9.5" (1.765 m)   Wt 198 lb 3.2 oz (89.9 kg)   SpO2 99%   BMI 28.85 kg/m  Gen: NAD, resting comfortably  CV: RRR  Lungs: nonlabored, normal respiratory rate Abdomen: soft/nondistended  Skin: warm, dry-8 sutures removed from scalp On the right side-wound healing well.  Removed 4 sutures from his hand.  4 sutures removed from left hand-wound remained well approximated-some scabbing noted     Assessment and Plan   #Social update-plans to spend Thanksgiving with his mom out of state after recent separation.  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 Also had transverse process C7 fracture.   - Repeat CT in the emergency room showed that subarachnoid hematoma  was resolving - no significant neurologic symptoms. -With severity of trauma and patient reporting Some pain with chewing on right side deep to nose with crunch/hard foods - CT maxillofacial was recommended but patient wanted to hold offat last visit with me - staple removal time frame scalp 7-10 days and suture removal hand 10-14 days - we opted for 10-day follow-up from original accident and removal of both - Encouraged patient to take his Keppra twice daily for full 7 days as recommended in regards to subarachnoid hemorrhage of left frontal lobe-he has now completed this-no seizure-like activity  We referred patient to Dr. Denyse Amass  last visit - Patient was seen by Dr. Denyse Amass 02/16/2021 for C7 transverse process fx and L hand pain. He also reported having pain is R-sided chest and R clavicle - complained of increased pain when coughing, deep breathing, and trunk motions. No pain noted to L hand/wrist - he was wearing a wrist splint. Also noted a "knot" along the R medial border of the scapula.    - C7 transverse fracture was minimally symptomatic and had been cleared to return to normal activity per spine surgery in the ER - Dr. Denyse Amass agreed with this.   - There was a concern for an occult rib fracture due to some anterior chest wall pain - was not able to detect through X-ray-radiology over read not yet completed   - Recommend tizanidine at bedtime as needed along with ibuprofen and Tylenol - patient is to return within 2 weeks for a recheck -No further work-up for the hand was suggested other than suture removal   Today patient reports  some ongoing fatigue after the injuries. 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 He is excited to be released.  He reports prior chewing issues have completely resolved without recurrence A/P: Patient has seen Dr. Denyse Amass for C7 transverse process fracture and has been released to full activity.  Main symptom now is some right-sided rib pain-pending radiology overread on x-ray-possible fracture.  There has been some concern for some lateral wrist fracture apparently but patient is having absolutely no pain in this area and does not want to pursue further work-up-in fact he did not even mention this to Dr. Denyse Amass as he was having absolutely no pain.  May continue tizanidine as needed for neck pain in the evenings  In regards to subarachnoid hemorrhage was improving on repeat CT -Repeat CT head discussed- he prefers to hold off unless headache, blurry vision, other new symptoms- no headache whatsoever.  He will certainly  contact us if any change in symptoms  # Depression-managed through Dr. Evelene Croon #ADD-also managed to Dr. Evelene Croon on Adderall 30 mg S: Medication:Xanax 1 mg 3x daily as needed, Celexa 20 mg daily  Depression screen [facility] 2/9 02/09/2021 09/28/2020  Decreased Interest 0 3  Down, Depressed, Hopeless 0 3  PHQ - 2 Score 0 6  Altered sleeping 0 3  Tired, decreased energy 0 3  Change in appetite 0 1  Feeling bad or failure about yourself  0 3  Trouble concentrating 1 2  Moving slowly or fidgety/restless 1 1  Suicidal thoughts 0 1  PHQ-9 Score 2 20  Difficult doing work/chores Somewhat difficult Somewhat difficult   A/P: Depression and anxiety has been well controlled despite recent injuries  #Low testosterone S:Not currently on any supplements or testosterone  -prior LFT elevation A/P: history of low T- wants to  recheck-will need to come back for testing I spent 8:52 AM  #hypertension S: medication: None Home readings #s: No recent checks BP Readings from Last 3 Encounters:  02/17/21 (!) 132/92  02/16/21 130/88  02/09/21 138/86  A/P: Patient admits to some anxiety today with suture removal and staple removal-blood pressure only minimally elevated-agrees to do some home monitoring and let us know-ideal home readings will be less than 135/85 on average  #hyperlipidemia S: Medication: None Lab Results  Component Value Date   CHOL 161 01/24/2016   HDL 27.80 (L) 01/24/2016   LDLCALC 111 (H) 01/24/2016   TRIG 108.0 01/24/2016   CHOLHDL 6 01/24/2016   A/P: Mild elevation in LDL on last check nearly 5 years ago-agrees to come back for fasting labs   #ADD-previously treated through Dr. Nickolas Madrid weaned myself off Adderall   Recommended follow up: Return in about 1 year (around 02/17/2022) for physical or sooner if needed. Future Appointments  Date Time Provider Department Center  03/02/2021  1:00 PM Rodolph Bong, MD [facility] None    Lab/Order associations:   ICD-10-CM   1. Subarachnoid  hemorrhage following injury, no loss of consciousness, subsequent encounter  S06.6X0D     2. Closed fracture of transverse process of cervical vertebra, subsequent encounter  S12.9XXD     3. Major depressive disorder with single episode, in full remission (HCC)  F32.5     4. Testosterone Deficiency  E29.1 Testosterone Total,Free,Bio, Males-([facility])    5. Hyperlipidemia  E78.2 CBC With Differential/Platelet    COMPLETE METABOLIC PANEL WITH GFR    Lipid panel    6. Essential hypertension  I10 CBC With Differential/Platelet    COMPLETE METABOLIC PANEL WITH GFR    Lipid panel      I,Harris Phan,acting as a scribe for Tana Conch, MD.,have documented all relevant documentation on the behalf of Tana Conch, MD,as directed by  Tana Conch, MD while in the presence of Tana Conch, MD.  I, Tana Conch, MD, have reviewed all documentation for this visit. The documentation on 02/17/21 for the exam, diagnosis, procedures, and orders are all accurate and complete.  Return precautions advised.  Tana Conch, MD

## 2021-02-17 NOTE — Progress Notes (Signed)
Radiology did not see a rib fracture.  There could be a rib fracture there that we just cannot see however.  This is good news though.

## 2021-02-17 NOTE — Patient Instructions (Addendum)
Health Maintenance Due  Topic Date Due   Pneumococcal Vaccine 29-41 Years old (2 - PCV)- hold off for now- recommended due to asthma 11/14/2013   Schedule a lab visit at the check out desk within 2 weeks. Return for future fasting labs meaning nothing but water after midnight please. Ok to take your medications with water.   If you find yourself at any time experiencing headaches, blurry vision, or any new symptoms , please let me know and we can consider having a CT ordered. Glad chewing issues resolved  Your blood pressure was slightly high today on diastolic reading- please do some home monitoring of your blood pressure. Goal is to be below 135/85 with home readings on average. 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

## 2021-02-21 ENCOUNTER — Telehealth: Payer: Self-pay | Admitting: *Deleted

## 2021-02-21 ENCOUNTER — Encounter: Payer: Self-pay | Admitting: Family Medicine

## 2021-02-21 NOTE — Telephone Encounter (Signed)
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

## 2021-02-21 NOTE — Telephone Encounter (Signed)
I called Darren Freeman back.  He is feeling ok but not 100%.  Letter written. Follow up as scheduled on 11/23

## 2021-03-01 NOTE — Progress Notes (Signed)
   Darren Freeman, am serving as a scribe for Dr. Clementeen Graham.   This visit occurred during the SARS-CoV-2 public health emergency.  Safety protocols were in place, including screening questions prior to the visit, additional usage of staff PPE, and extensive cleaning of exam room while observing appropriate contact time as indicated for disinfecting solutions.   Darren Freeman is a 40 y.o. male who presents to [facility] today for f/u C7 transverse process fx, R hand and anterior chest pain. 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 Pt was seen at the [facility] ED on 02/07/21 and received 4 sutures in L hand to repair wound and 8 staples to repair the scalp laceration. Additionally, pt was dx w/ a R non-displaced C7 transverse process fx and small SAH L frontal lobe. Today, pt reports that he continues to have chest pain with sneezing over R side of sternum. Pain is improving otherwise. Also notes swelling on R side of his head. Feeling back to normal. Is able to run 3 miles each day.   Dx imaging: 02/16/21 R ribs/chest XR  02/07/21 Brain & c-spine CT, R & L elbow, chest, L wrist, t-spine, R hip, (done at [facility] ED)  Pertinent review of systems: No fevers or chills  Relevant historical information: Hypertension   Exam:  BP 108/78   Pulse 62   Ht 5' 9.5" (1.765 m)   Wt 193 lb (87.5 kg)   SpO2 98%   BMI 28.09 kg/m  General: Well Developed, well nourished, and in no acute distress.   MSK: Right scalp well appearing mature scar Slight soft slight fluctuant area right parietal scalp nontender.  Consistent in palpation with seroma.  Right chest wall minimally tender.  Cervical spine normal motion.  Upper extremity strength is intact.   Neuro: Alert and oriented normal coordination and gait   Lab and Radiology Results No results  found for this or any previous visit (from the past 72 hour(s)). No results found.     Assessment and Plan: 40 y.o. male with follow-up from significant head injury and cervical spine fracture.  Less than 1 month ago (October 31) Darren Freeman was diagnosed with a C7 transverse spinous fracture and a small subarachnoid hemorrhage after a head neck injury.  He has done extremely well since and effectively is back to very near his baseline or his baseline.  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  The small area on the right scalp is a seroma and will resolve and is resolving on its own.  Recheck as needed.  Precautions reviewed. Total encounter time 20 minutes including face-to-face time with the patient and, reviewing past medical record, and charting on the date of service.   Discussed precautions.      Discussed warning signs or symptoms. Please see discharge instructions. Patient expresses understanding.   The above documentation has been reviewed and is accurate and complete Clementeen Graham, M.D.

## 2021-03-02 ENCOUNTER — Other Ambulatory Visit: Payer: Self-pay

## 2021-03-02 ENCOUNTER — Ambulatory Visit (INDEPENDENT_AMBULATORY_CARE_PROVIDER_SITE_OTHER): Payer: 59 | Admitting: Family Medicine

## 2021-03-02 VITALS — BP 108/78 | HR 62 | Ht 69.5 in | Wt 193.0 lb

## 2021-03-02 DIAGNOSIS — S129XXD Fracture of neck, unspecified, subsequent encounter: Secondary | ICD-10-CM | POA: Diagnosis not present

## 2021-03-02 DIAGNOSIS — S066X0D Traumatic subarachnoid hemorrhage without loss of consciousness, subsequent encounter: Secondary | ICD-10-CM

## 2021-03-02 DIAGNOSIS — R079 Chest pain, unspecified: Secondary | ICD-10-CM | POA: Diagnosis not present

## 2021-03-02 NOTE — Patient Instructions (Addendum)
Thank you for coming in today.   I think you have a seroma.   Recheck as needed.

## 2021-03-26 DIAGNOSIS — H524 Presbyopia: Secondary | ICD-10-CM | POA: Diagnosis not present

## 2021-04-26 ENCOUNTER — Telehealth: Payer: Self-pay | Admitting: Family Medicine

## 2021-04-26 NOTE — Telephone Encounter (Signed)
Pt was advised to call his Dentist.  Patient Name: Darren Freeman Gender: Male DOB: 07-03-1980 Age: 41 Y 3 M 6 D Return Phone Number: [phone] (Primary) Address: City/ State/ Zip: [place]  [address] Client [facility] - Human resources [facility] [employer] Provider Tana Conch- MD Contact Type Call Who Is Calling Patient / Member / Family / Caregiver Call Type Triage / Clinical Relationship To Patient Self Return Phone Number [phone] (Primary) Chief Complaint Tooth Injury Reason for Call Symptomatic / Request for Health Information Initial Comment Caller states he has a broken tooth. He had an appointment on the 24th with a dentist. He thinks he needs an antibiotic for an exposed root. Translation No Nurse Assessment Nurse: Lane Hacker, RN, Windy Date/Time (Eastern Time): 04/26/2021 7:49:48 AM Confirm and document reason for call. If symptomatic, describe symptoms. ---Caller states he has a broken tooth since this past Summer when eating. Started having pain/sensitivity in last 2 wks. c/o right side of face is painful, no swelling. No fever. - He had an appointment on the 24th with a dentist. He thinks he may need an antibiotic for an exposed root. Does the patient have any new or worsening symptoms? ---Yes Will a triage be completed? ---Yes Related visit to physician within the last 2 weeks? ---No Does the PT have any chronic conditions? (i.e. diabetes, asthma, this includes High risk factors for pregnancy, etc.) ---No Is this a behavioral health or substance abuse call? ---No Guidelines Guideline Title Affirmed Question Affirmed Notes Nurse Date/Time (Eastern Time) Toothache Toothache present > 24 hours Lane Hacker, Charity fundraiser, Windy 04/26/2021 7:51:40 AM Disp. Time Lamount Cohen Time) Disposition Final User 04/26/2021 7:38:10 AM Attempt made - no message left Emelda Fear 04/26/2021 7:53:37 AM Call  Dentist when Office is Open Yes Lane Hacker, RN, Elvin So Caller Disagree/Comply Comply Caller Understands Yes PreDisposition Call Doctor Care Advice Given Per Guideline CALL DENTIST WHEN OFFICE IS OPEN: * You need to discuss this with your dentist within the next few days. PAIN MEDICINES: * For pain relief, you can take either acetaminophen, ibuprofen, or naproxen. * Before taking any medicine, read all the instructions on the package. LOCAL COLD: * Apply a cold pack or ice in a wet washcloth to the painful area of the face for 20 minutes. CALL BACK IF: * Fever or facial swelling occurs * You become worse CARE ADVICE given per Toothache (Adult) guideline. Comments User: Rubie Maid, RN Date/Time Lamount Cohen Time): 04/26/2021 7:38:45 AM Call can not be completed as dialed per automated msg. [phone]. RN will have PC to verify #. User: Rubie Maid, RN Date/Time Lamount Cohen Time): 04/26/2021 7:48:51 AM # should be [phone] per Amy PC lead. Will try again. User: Rubie Maid, RN Date/Time Lamount Cohen Time): 04/26/2021 7:52:31 AM 5/10 pain intensity and then suddenly will be severe. Taking ibuprofen. Referrals REFERRED TO PCP OFFICE

## 2021-04-26 NOTE — Telephone Encounter (Signed)
Noted  

## 2021-06-07 ENCOUNTER — Ambulatory Visit (INDEPENDENT_AMBULATORY_CARE_PROVIDER_SITE_OTHER): Payer: 59 | Admitting: Family Medicine

## 2021-06-07 ENCOUNTER — Encounter: Payer: Self-pay | Admitting: Family Medicine

## 2021-06-07 ENCOUNTER — Other Ambulatory Visit: Payer: Self-pay

## 2021-06-07 VITALS — BP 130/98 | HR 78 | Temp 98.4°F | Ht 69.0 in | Wt 188.2 lb

## 2021-06-07 DIAGNOSIS — H00011 Hordeolum externum right upper eyelid: Secondary | ICD-10-CM | POA: Diagnosis not present

## 2021-06-07 MED ORDER — CIPROFLOXACIN HCL 0.3 % OP SOLN: 2.0000 [drp] | OPHTHALMIC | 0 refills | Status: DC

## 2021-06-07 NOTE — Progress Notes (Signed)
Subjective:     Patient ID: Koben Daman, male    DOB: 07-10-1980, 41 y.o.   MRN: [id number]  Chief Complaint  Patient presents with   Eye Problem    Right eye swollen, red and drainage is coming out of the top eyelid. Present for about a week.    HPI Eye infection-R eye swollen, red, drainage from top eyelid x few days(since 2/25).  Then bump and pt squeezed it. Clear liquid coming out.   No eye pain, just eyelid.  No warm compresses.  No visual changes. Yellow crusty in corners.  If squeezes it, drains clear and irrit eye.  No opt. No f/c.   There are no preventive care reminders to display for this patient.   Past Medical History:  Diagnosis Date   ADD (attention deficit disorder)    Anxiety    Asthma    ? allergy related. can run and no issues. sometimes wheeze or SOB after lifting or sex   Depression    Elevated LFTs    GERD (gastroesophageal reflux disease)    Glucose intolerance (impaired glucose tolerance)    previously on metformin   HTN (hypertension)    Hypogonadism male    Low testosterone    Vitamin D deficiency     Past Surgical History:  Procedure Laterality Date   penile surgery as child- thin urethra      Outpatient Medications Prior to Visit  Medication Sig Dispense Refill   ALPRAZolam (XANAX) 1 MG tablet Take 1 mg by mouth 3 (three) times daily as needed.     amphetamine-dextroamphetamine (ADDERALL) 30 MG tablet Take 1 tablet by mouth 3 (three) times daily.     Ascorbic Acid (VITAMIN C) 1000 MG tablet Take 1,000 mg by mouth daily.      citalopram (CELEXA) 20 MG tablet Take 1 tablet (20 mg total) by mouth daily. 30 tablet 3   Multiple Vitamin (MULTIVITAMIN) tablet Take 1 tablet by mouth daily.     tiZANidine (ZANAFLEX) 4 MG tablet Take 1 tablet (4 mg total) by mouth every 6 (six) hours as needed for muscle spasms. (Patient not taking: Reported on 06/07/2021) 30 tablet 1   No facility-administered medications prior to visit.    No Known  Allergies ROS neg/noncontributory except as noted HPI/below      Objective:     BP (!) 130/98    Pulse 78    Temp 98.4 F (36.9 C) (Temporal)    Ht 5\' 9"  (1.753 m)    Wt 188 lb 3.2 oz (85.4 kg)    SpO2 97%    BMI 27.79 kg/m  Wt Readings from Last 3 Encounters:  06/07/21 188 lb 3.2 oz (85.4 kg)  03/02/21 193 lb (87.5 kg)  02/17/21 198 lb 3.2 oz (89.9 kg)        Gen: WDWN NAD muscular wm HEENT: NCAT, conjunctiva on R injected.  +yellow/crusty d/c corners.  Draining stye upper R lid and swelling/redness.  sclera nonicteric MSK: no gross abnormalities.  NEURO: A&O x3.  CN II-XII intact.  PSYCH: normal mood. Good eye contact  Assessment & Plan:   Problem List Items Addressed This Visit   None Visit Diagnoses     Hordeolum externum of right upper eyelid    -  Primary      Draining stye R upper eyelid-soaks, baby shampoo, cipro eye drops f/u opth if worse, not improving  Meds ordered this encounter  Medications   ciprofloxacin (CILOXAN) 0.3 %  ophthalmic solution    Sig: Place 2 drops into the right eye every 4 (four) hours while awake.    Dispense:  5 mL    Refill:  0    Angelena Sole, MD

## 2021-06-07 NOTE — Patient Instructions (Signed)
Warm soaks sev x/day.  Baby shampoo eyelid Cipro drops See eye doctor if not improving

## 2021-06-28 ENCOUNTER — Other Ambulatory Visit (HOSPITAL_BASED_OUTPATIENT_CLINIC_OR_DEPARTMENT_OTHER): Payer: Self-pay

## 2021-06-28 MED ORDER — CITALOPRAM HYDROBROMIDE 40 MG PO TABS
ORAL_TABLET | Freq: Every day | ORAL | 1 refills | Status: DC
Start: 2021-07-01 — End: 2021-09-23
  Filled 2021-09-18: qty 30, 30d supply, fill #0

## 2021-06-28 MED ORDER — ALPRAZOLAM 1 MG PO TABS
ORAL_TABLET | ORAL | 0 refills | Status: DC
  Filled 2021-06-28: qty 90, 30d supply, fill #0

## 2021-06-28 MED ORDER — AMPHETAMINE-DEXTROAMPHETAMINE 30 MG PO TABS
1.0000 | ORAL_TABLET | Freq: Two times a day (BID) | ORAL | 0 refills | Status: DC
Start: 2021-07-01 — End: 2022-01-05
  Filled 2021-07-01: qty 60, 30d supply, fill #0

## 2021-06-28 MED ORDER — CITALOPRAM HYDROBROMIDE 40 MG PO TABS
ORAL_TABLET | Freq: Every day | ORAL | 1 refills | Status: DC
  Filled 2021-08-16: qty 30, 30d supply, fill #0
  Filled 2021-09-27: qty 30, 30d supply, fill #1

## 2021-07-01 ENCOUNTER — Other Ambulatory Visit (HOSPITAL_BASED_OUTPATIENT_CLINIC_OR_DEPARTMENT_OTHER): Payer: Self-pay

## 2021-07-05 ENCOUNTER — Other Ambulatory Visit (HOSPITAL_BASED_OUTPATIENT_CLINIC_OR_DEPARTMENT_OTHER): Payer: Self-pay

## 2021-07-25 ENCOUNTER — Other Ambulatory Visit (HOSPITAL_COMMUNITY): Payer: Self-pay

## 2021-08-16 ENCOUNTER — Other Ambulatory Visit (HOSPITAL_BASED_OUTPATIENT_CLINIC_OR_DEPARTMENT_OTHER): Payer: Self-pay

## 2021-08-16 ENCOUNTER — Other Ambulatory Visit: Payer: Self-pay | Admitting: Family Medicine

## 2021-08-16 NOTE — Telephone Encounter (Signed)
This has traditionally been through psychiatrist-have we recently prescribed this?  I do not see any current medication list-probably best to reach back out to psychiatry or we can schedule visit to discuss but ideally with ADD treatment as well will be managed by psychiatry ?

## 2021-08-19 ENCOUNTER — Other Ambulatory Visit (HOSPITAL_BASED_OUTPATIENT_CLINIC_OR_DEPARTMENT_OTHER): Payer: Self-pay

## 2021-08-19 MED ORDER — ALPRAZOLAM 1 MG PO TABS
ORAL_TABLET | ORAL | 0 refills | Status: DC
  Filled 2021-08-19: qty 90, 30d supply, fill #0

## 2021-08-25 ENCOUNTER — Other Ambulatory Visit (HOSPITAL_BASED_OUTPATIENT_CLINIC_OR_DEPARTMENT_OTHER): Payer: Self-pay

## 2021-08-25 MED ORDER — AMPHETAMINE-DEXTROAMPHETAMINE 30 MG PO TABS
1.0000 | ORAL_TABLET | Freq: Two times a day (BID) | ORAL | 0 refills | Status: DC
  Filled 2021-08-29: qty 60, 30d supply, fill #0

## 2021-08-29 ENCOUNTER — Other Ambulatory Visit (HOSPITAL_BASED_OUTPATIENT_CLINIC_OR_DEPARTMENT_OTHER): Payer: Self-pay

## 2021-09-09 NOTE — Telephone Encounter (Signed)
Called and lm for pt tcb. 

## 2021-09-19 ENCOUNTER — Other Ambulatory Visit (HOSPITAL_BASED_OUTPATIENT_CLINIC_OR_DEPARTMENT_OTHER): Payer: Self-pay

## 2021-09-23 ENCOUNTER — Ambulatory Visit: Payer: 59 | Admitting: Family Medicine

## 2021-09-23 ENCOUNTER — Encounter: Payer: Self-pay | Admitting: Family Medicine

## 2021-09-23 ENCOUNTER — Other Ambulatory Visit (HOSPITAL_BASED_OUTPATIENT_CLINIC_OR_DEPARTMENT_OTHER): Payer: Self-pay

## 2021-09-23 VITALS — BP 127/76 | HR 74 | Temp 98.7°F | Ht 69.0 in | Wt 193.0 lb

## 2021-09-23 DIAGNOSIS — J4521 Mild intermittent asthma with (acute) exacerbation: Secondary | ICD-10-CM | POA: Diagnosis not present

## 2021-09-23 DIAGNOSIS — J302 Other seasonal allergic rhinitis: Secondary | ICD-10-CM | POA: Diagnosis not present

## 2021-09-23 MED ORDER — ALBUTEROL SULFATE HFA 108 (90 BASE) MCG/ACT IN AERS: 2.0000 | INHALATION_SPRAY | Freq: Four times a day (QID) | RESPIRATORY_TRACT | 5 refills | Status: DC | PRN

## 2021-09-23 NOTE — Progress Notes (Signed)
Phone [phone] In person visit   Subjective:   Darren Freeman is a 41 y.o. year old very pleasant male patient who presents for/with See problem oriented charting Chief Complaint  Patient presents with   Breathing Problem    Pt stated that he has been having a hard time after working out or even when he is sitting for the past yr at least.    Past Medical History-  Patient Active Problem List   Diagnosis Date Noted   Subarachnoid hemorrhage following injury, no loss of consciousness (HCC) 02/16/2021    Priority: High   Closed fracture of transverse process of cervical vertebra (HCC) 02/16/2021    Priority: High   Testosterone Deficiency 09/06/2011    Priority: High   Elevated transaminase level 01/22/2016    Priority: Medium    Intractable hiccups 01/21/2016    Priority: Medium    Depression 01/21/2016    Priority: Medium    Asthma     Priority: Medium    Hyperlipidemia 08/18/2013    Priority: Medium    Essential hypertension 08/18/2013    Priority: Medium    ADD (attention deficit disorder)     Priority: Medium    GERD (gastroesophageal reflux disease) 01/21/2016    Priority: Low   Insomnia 01/21/2016    Priority: Low   Abnormal glucose 08/18/2013    Priority: Low   Vitamin D deficiency     Priority: Low    Medications- reviewed and updated Current Outpatient Medications  Medication Sig Dispense Refill   albuterol (VENTOLIN HFA) 108 (90 Base) MCG/ACT inhaler Inhale 2 puffs into the lungs every 6 (six) hours as needed for wheezing or shortness of breath. 1 each 5   ALPRAZolam (XANAX) 1 MG tablet 1 PO TID as needed 90 tablet 0   amphetamine-dextroamphetamine (ADDERALL) 30 MG tablet 1 TABLET TWICE DAILY 60 tablet 0   amphetamine-dextroamphetamine (ADDERALL) 30 MG tablet 1 TABLET TWICE DAILY 60 tablet 0   Ascorbic Acid (VITAMIN C) 1000 MG tablet Take 1,000 mg by mouth daily.      Multiple Vitamin (MULTIVITAMIN) tablet Take 1 tablet by mouth daily.      citalopram (CELEXA) 40 MG tablet 1 PO QD (Patient not taking: Reported on 09/23/2021) 30 tablet 1   No current facility-administered medications for this visit.     Objective:  BP 127/76   Pulse 74   Temp 98.7 F (37.1 C)   Ht 5\' 9"  (1.753 m)   Wt 193 lb (87.5 kg)   SpO2 99%   BMI 28.50 kg/m  Gen: NAD, resting comfortably Intermittent hiccups-stomach seems somewhat distended related to this CV: RRR no murmurs rubs or gallops Lungs: Appears to be uncomfortable primarily from hiccups.  Lungs with some mild wheezes but no crackles or rhonchi Ext: no edema Skin: warm, dry    Assessment and Plan   # Asthma with exacerbation #Seasonal allergies S:10 years ago was given albuterol when he was having a hard time catching breath with activity.   He has noted need to take deep breaths  Still able to run 3 miles every morning. More intense or anaerobic exercise leads him to huffing and puffing and can be prolonged. He has noted wheezing. 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 He is lifting weights but does more circuits so becomes more aerobic- if tries to do more intense he cannot. Also has had hiccups for a few days- happened years ago on klonopin-  he took his alprazolam recently which seemed to trigger things. No chest pain   Symptoms worsened over 2-3 months feels allergies could play a role. Not taking anything for allergies.   A/P: poor control of asthma particularly with anaerobic type workouts - gets winded and stays winded. In past with similar experience he responded well to albuterol and he requested refill on this.  The fact that his symptoms started 2 to 3 months ago and it has been a tough allergy season and he has been off allergy medications may point toward allergies being a trigger-recommend he start Allegra nightly which he took in the past.  No chest pain, diaphoresis, left arm or neck pain and we did not opt to pursue cardiac work-up at  this time.  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 Denyse Amass last year and he reports he has healed up well after this  Recommended follow up: Return in about 4 months (around 01/23/2022) for physical or sooner if needed.Schedule b4 you leave. Future Appointments  Date Time Provider Department Center  02/23/2022 10:00 AM Shelva Majestic, MD [facility]   Lab/Order associations:   ICD-10-CM   1. Mild intermittent asthma with acute exacerbation  J45.21     2. Seasonal allergies  J30.2       Meds ordered this encounter  Medications   albuterol (VENTOLIN HFA) 108 (90 Base) MCG/ACT inhaler    Sig: Inhale 2 puffs into the lungs every 6 (six) hours as needed for wheezing or shortness of breath.    Dispense:  1 each    Refill:  5    Return precautions advised.  Tana Conch, MD

## 2021-09-23 NOTE — Patient Instructions (Addendum)
We strongly considered x-ray and pulmonology consult- but I understand your preference to trial albuterol first with prior good results- definitely before activity would trial this.   Update me in 2 weeks- also add allegra back in before bed to see if that helps  Recommended follow up: Return in about 4 months (around 01/23/2022) for physical or sooner if needed.Schedule b4 you leave.

## 2021-09-26 ENCOUNTER — Other Ambulatory Visit (HOSPITAL_BASED_OUTPATIENT_CLINIC_OR_DEPARTMENT_OTHER): Payer: Self-pay

## 2021-09-26 MED ORDER — ALPRAZOLAM 1 MG PO TABS
ORAL_TABLET | ORAL | 2 refills | Status: DC
  Filled 2021-09-26: qty 90, 30d supply, fill #0
  Filled 2021-10-28: qty 90, 30d supply, fill #1
  Filled 2021-11-23: qty 90, 30d supply, fill #2
  Filled 2021-11-25: qty 10, 3d supply, fill #2
  Filled 2021-11-25: qty 80, 27d supply, fill #2

## 2021-09-26 MED ORDER — AMPHETAMINE-DEXTROAMPHETAMINE 30 MG PO TABS
1.0000 | ORAL_TABLET | Freq: Two times a day (BID) | ORAL | 0 refills | Status: DC
  Filled 2021-09-28: qty 60, 30d supply, fill #0

## 2021-09-28 ENCOUNTER — Other Ambulatory Visit (HOSPITAL_BASED_OUTPATIENT_CLINIC_OR_DEPARTMENT_OTHER): Payer: Self-pay

## 2021-10-26 ENCOUNTER — Other Ambulatory Visit (HOSPITAL_BASED_OUTPATIENT_CLINIC_OR_DEPARTMENT_OTHER): Payer: Self-pay

## 2021-10-26 MED ORDER — AMPHETAMINE-DEXTROAMPHETAMINE 30 MG PO TABS
1.0000 | ORAL_TABLET | Freq: Two times a day (BID) | ORAL | 0 refills | Status: DC
  Filled 2021-10-28: qty 60, 30d supply, fill #0

## 2021-10-28 ENCOUNTER — Other Ambulatory Visit (HOSPITAL_BASED_OUTPATIENT_CLINIC_OR_DEPARTMENT_OTHER): Payer: Self-pay

## 2021-10-28 MED ORDER — CITALOPRAM HYDROBROMIDE 40 MG PO TABS
ORAL_TABLET | Freq: Every day | ORAL | 3 refills | Status: DC
  Filled 2021-10-28 – 2021-11-23 (×2): qty 30, 30d supply, fill #0
  Filled 2022-03-17: qty 30, 30d supply, fill #1
  Filled 2022-05-23: qty 30, 30d supply, fill #2
  Filled 2022-06-18: qty 30, 30d supply, fill #3

## 2021-11-09 ENCOUNTER — Other Ambulatory Visit (HOSPITAL_BASED_OUTPATIENT_CLINIC_OR_DEPARTMENT_OTHER): Payer: Self-pay

## 2021-11-23 ENCOUNTER — Other Ambulatory Visit (HOSPITAL_BASED_OUTPATIENT_CLINIC_OR_DEPARTMENT_OTHER): Payer: Self-pay

## 2021-11-23 MED ORDER — AMPHETAMINE-DEXTROAMPHETAMINE 30 MG PO TABS
1.0000 | ORAL_TABLET | Freq: Two times a day (BID) | ORAL | 0 refills | Status: DC
  Filled 2021-11-25: qty 60, 30d supply, fill #0

## 2021-11-25 ENCOUNTER — Other Ambulatory Visit (HOSPITAL_BASED_OUTPATIENT_CLINIC_OR_DEPARTMENT_OTHER): Payer: Self-pay

## 2021-12-26 ENCOUNTER — Other Ambulatory Visit (HOSPITAL_BASED_OUTPATIENT_CLINIC_OR_DEPARTMENT_OTHER): Payer: Self-pay

## 2021-12-26 MED ORDER — AMPHETAMINE-DEXTROAMPHETAMINE 30 MG PO TABS
1.0000 | ORAL_TABLET | Freq: Two times a day (BID) | ORAL | 0 refills | Status: DC
  Filled 2021-12-27: qty 60, 30d supply, fill #0

## 2021-12-27 ENCOUNTER — Other Ambulatory Visit (HOSPITAL_BASED_OUTPATIENT_CLINIC_OR_DEPARTMENT_OTHER): Payer: Self-pay

## 2021-12-27 MED ORDER — ALPRAZOLAM 1 MG PO TABS
1.0000 mg | ORAL_TABLET | Freq: Three times a day (TID) | ORAL | 2 refills | Status: DC | PRN
  Filled 2021-12-27: qty 90, 30d supply, fill #0
  Filled 2022-01-20 – 2022-01-27 (×2): qty 90, 30d supply, fill #1
  Filled 2022-02-27: qty 90, 30d supply, fill #2

## 2021-12-29 ENCOUNTER — Other Ambulatory Visit (HOSPITAL_BASED_OUTPATIENT_CLINIC_OR_DEPARTMENT_OTHER): Payer: Self-pay

## 2022-01-02 ENCOUNTER — Encounter: Payer: Self-pay | Admitting: *Deleted

## 2022-01-05 ENCOUNTER — Ambulatory Visit (HOSPITAL_COMMUNITY)
Admission: EM | Admit: 2022-01-05 | Discharge: 2022-01-06 | Disposition: A | Discharge status: Other Acute Inpatient Hospital | Payer: 59 | Attending: Family | Admitting: Family

## 2022-01-05 DIAGNOSIS — F32A Depression, unspecified: Secondary | ICD-10-CM | POA: Insufficient documentation

## 2022-01-05 DIAGNOSIS — F29 Unspecified psychosis not due to a substance or known physiological condition: Secondary | ICD-10-CM | POA: Insufficient documentation

## 2022-01-05 DIAGNOSIS — F419 Anxiety disorder, unspecified: Secondary | ICD-10-CM | POA: Diagnosis not present

## 2022-01-05 DIAGNOSIS — R69 Illness, unspecified: Secondary | ICD-10-CM | POA: Diagnosis not present

## 2022-01-05 DIAGNOSIS — F22 Delusional disorders: Secondary | ICD-10-CM | POA: Diagnosis not present

## 2022-01-05 DIAGNOSIS — F909 Attention-deficit hyperactivity disorder, unspecified type: Secondary | ICD-10-CM | POA: Diagnosis not present

## 2022-01-05 DIAGNOSIS — Z20822 Contact with and (suspected) exposure to covid-19: Secondary | ICD-10-CM | POA: Insufficient documentation

## 2022-01-05 LAB — COMPREHENSIVE METABOLIC PANEL
ALT: 76 U/L — ABNORMAL HIGH (ref 0–44)
AST: 49 U/L — ABNORMAL HIGH (ref 15–41)
Albumin: 4.2 g/dL (ref 3.5–5.0)
Alkaline Phosphatase: 44 U/L (ref 38–126)
Anion gap: 11 (ref 5–15)
BUN: 15 mg/dL (ref 6–20)
CO2: 28 mmol/L (ref 22–32)
Calcium: 9.3 mg/dL (ref 8.9–10.3)
Chloride: 101 mmol/L (ref 98–111)
Creatinine, Ser: 1.18 mg/dL (ref 0.61–1.24)
GFR, Estimated: >60 mL/min (ref >60)
Glucose, Bld: 84 mg/dL (ref 70–99)
Potassium: 4.6 mmol/L (ref 3.5–5.1)
Sodium: 140 mmol/L (ref 135–145)
Total Bilirubin: 1.1 mg/dL (ref 0.3–1.2)
Total Protein: 7.4 g/dL (ref 6.5–8.1)

## 2022-01-05 LAB — CBC WITH DIFFERENTIAL/PLATELET
Abs Immature Granulocytes: 0.2 10*3/uL — ABNORMAL HIGH (ref 0.00–0.07)
Basophils Absolute: 0.1 10*3/uL (ref 0.0–0.1)
Basophils Relative: 1 %
Eosinophils Absolute: 0.2 10*3/uL (ref 0.0–0.5)
Eosinophils Relative: 2 %
HCT: 52.2 % — ABNORMAL HIGH (ref 39.0–52.0)
Hemoglobin: 17.5 g/dL — ABNORMAL HIGH (ref 13.0–17.0)
Immature Granulocytes: 3 %
Lymphocytes Relative: 19 %
Lymphs Abs: 1.3 10*3/uL (ref 0.7–4.0)
MCH: 29.8 pg (ref 26.0–34.0)
MCHC: 33.5 g/dL (ref 30.0–36.0)
MCV: 88.8 fL (ref 80.0–100.0)
Monocytes Absolute: 0.5 10*3/uL (ref 0.1–1.0)
Monocytes Relative: 6 %
Neutro Abs: 4.9 10*3/uL (ref 1.7–7.7)
Neutrophils Relative %: 69 %
Platelets: 211 10*3/uL (ref 150–400)
RBC: 5.88 MIL/uL — ABNORMAL HIGH (ref 4.22–5.81)
RDW: 13.7 % (ref 11.5–15.5)
WBC: 7.1 10*3/uL (ref 4.0–10.5)
nRBC: 0 % (ref 0.0–0.2)

## 2022-01-05 LAB — POCT URINE DRUG SCREEN - MANUAL ENTRY (I-SCREEN)
POC Amphetamine UR: NOT DETECTED
POC Buprenorphine (BUP): NOT DETECTED
POC Cocaine UR: NOT DETECTED
POC Marijuana UR: POSITIVE — AB
POC Methadone UR: NOT DETECTED
POC Methamphetamine UR: NOT DETECTED
POC Morphine: NOT DETECTED
POC Oxazepam (BZO): POSITIVE — AB
POC Oxycodone UR: NOT DETECTED
POC Secobarbital (BAR): NOT DETECTED

## 2022-01-05 LAB — ETHANOL: Alcohol, Ethyl (B): <10 mg/dL (ref <10)

## 2022-01-05 LAB — HEMOGLOBIN A1C
Hgb A1c MFr Bld: 5.1 % (ref 4.8–5.6)
Mean Plasma Glucose: 99.67 mg/dL

## 2022-01-05 LAB — LIPID PANEL
Cholesterol: 181 mg/dL (ref 0–200)
HDL: 46 mg/dL (ref >40)
LDL Cholesterol: 114 mg/dL — ABNORMAL HIGH (ref 0–99)
Total CHOL/HDL Ratio: 3.9 RATIO
Triglycerides: 105 mg/dL (ref <150)
VLDL: 21 mg/dL (ref 0–40)

## 2022-01-05 LAB — TSH: TSH: 0.603 u[IU]/mL (ref 0.350–4.500)

## 2022-01-05 LAB — POC SARS CORONAVIRUS 2 AG: SARSCOV2ONAVIRUS 2 AG: NEGATIVE

## 2022-01-05 LAB — RESP PANEL BY RT-PCR (FLU A&B, COVID) ARPGX2
Influenza A by PCR: NEGATIVE
Influenza B by PCR: NEGATIVE
SARS Coronavirus 2 by RT PCR: NEGATIVE

## 2022-01-05 LAB — MAGNESIUM: Magnesium: 2.1 mg/dL (ref 1.7–2.4)

## 2022-01-05 MED ORDER — ALUM & MAG HYDROXIDE-SIMETH 200-200-20 MG/5ML PO SUSP: 30.0000 mL | ORAL | Status: DC | PRN

## 2022-01-05 MED ORDER — TRAZODONE HCL 50 MG PO TABS
50.0000 mg | ORAL_TABLET | Freq: Every evening | ORAL | Status: DC | PRN
  Administered 2022-01-05: 50 mg via ORAL
  Filled 2022-01-05: qty 1

## 2022-01-05 MED ORDER — MAGNESIUM HYDROXIDE 400 MG/5ML PO SUSP: 30.0000 mL | Freq: Every day | ORAL | Status: DC | PRN

## 2022-01-05 MED ORDER — ALBUTEROL SULFATE HFA 108 (90 BASE) MCG/ACT IN AERS: 2.0000 | INHALATION_SPRAY | Freq: Four times a day (QID) | RESPIRATORY_TRACT | Status: DC | PRN

## 2022-01-05 MED ORDER — HYDROXYZINE HCL 25 MG PO TABS
25.0000 mg | ORAL_TABLET | Freq: Three times a day (TID) | ORAL | Status: DC | PRN
  Administered 2022-01-05: 25 mg via ORAL
  Filled 2022-01-05: qty 1

## 2022-01-05 MED ORDER — ACETAMINOPHEN 325 MG PO TABS: 650.0000 mg | ORAL_TABLET | Freq: Four times a day (QID) | ORAL | Status: DC | PRN

## 2022-01-05 MED ORDER — OLANZAPINE 5 MG PO TBDP
5.0000 mg | ORAL_TABLET | Freq: Every day | ORAL | Status: DC
  Administered 2022-01-05: 5 mg via ORAL
  Filled 2022-01-05: qty 1

## 2022-01-05 NOTE — ED Notes (Signed)
Pt sleeping in recliner. Awakened, alert/oriented x4. Denies SI/HI, AVH. Denies pain. Pt. States that his mother thought he should be evaluated due to his delusional thoughts. He states that he now realizes they were not true. Resp even and unlabored. Will continue to monitor for safety.

## 2022-01-05 NOTE — Progress Notes (Signed)
Patient presents to the [facility] with delusional thinking that he has stumbled into a sex trafficking ring, he states that he feels like people have been coming into his home and he found some graphic pictures, but now they are gone. He states that his family relationships are being affected by his delusional thoughts.  He states that he has been taking Adderall and Celexa.  He states that he was prescribed Celexa for his anxiety, but states that he is not taking Adderall consistently. He states that he has been abusing marijuana, "smoking flower." Patient has a history situational depression, but states that he does not feel like he is clinically depressed.    Patient states that he and his wife split up 2 years ago and he has been struggling since.  Patient has not been able o see them in the past two weeks due to his current mental status..  Patient denies SI/HI.  Patient is urgent.

## 2022-01-05 NOTE — BH Assessment (Addendum)
Comprehensive Clinical Assessment (CCA) Note  01/05/2022 Darren Freeman [id number]  Disposition:  Per Doran Heater, NP, patient is recommended for inpatient treatment.  The patient demonstrates the following risk factors for suicide: Chronic risk factors for suicide include: psychiatric disorder of depression, anxiety and ADD . Acute risk factors for suicide include: family or marital conflict. Protective factors for this patient include: positive social support, positive therapeutic relationship, responsibility to others (children, family), and hope for the future. Considering these factors, the overall suicide risk at this point appears to be low. Patient is not appropriate for outpatient follow up due to his current level of psychosis  PHQ2-9    Flowsheet Row ED from 01/05/2022 in [facility] Visit from 09/23/2021 in [facility] Visit from 06/07/2021 in [facility] Visit from 02/09/2021 in [facility] from 09/28/2020 in [facility]  PHQ-2 Total Score 3 0 0 0 6  PHQ-9 Total Score 8 -- 0 2 20      Flowsheet Row ED from 01/05/2022 in [facility]  C-SSRS RISK CATEGORY No Risk       Chief Complaint:  Chief Complaint  Patient presents with   Delusional   Visit Diagnosis: F29 Unspecified Psychosis   CCA Screening, Triage and Referral (STR)  Patient Reported Information How did you hear about [place]? Family/Friend  What Is the Reason for Your Visit/Call Today? Patient presents to the [facility] with delusional thinking that he has stumbled into a sex trafficking ring, he states that he feels like people have been coming into his home and he found some graphic pictures, but now they are gone. He states that his family relationships are being affected by his delusional thoughts.  He states that he has been taking  Adderall and Celexa.  He states that he was prescribed Celexa for his anxiety, but states that he is not taking Adderall consistently. He states that he has been abusing marijuana, "smoking flower." Patient has a history situational depression, but states that he does not feel like he is clinically depressed.    Patient states that he and his wife split up 2 years ago and he has been struggling since.  Patient has not been able o see them in the past two weeks due to his current mental status..  Patient denies SI/HI.  Patient is urgent.  Assessment Note: Patient has a history of anxiety and depression as well as ADD.  However, things seem to have worsened since he and his wife separated two years ago. Patient states that his current delusional thinking began when [place].  He states that he started noticing people driving down his private road and pulling into his driveway at the dead end with their flashers on in the early in the morning and he concluded from this that his property is being used as a drop off for sex traffickers.  He states that their is a trail behind his house that he think they use to go to other locations.  He states that he has done a lot of research on sex trafficking and is convinced this is happening.  He states that he can hear and see them at night and they are flashing red lights. Mother, Milinda Pointer, present with patient states that this is not the first episode where he had a sexually focused delusion.  She states that  his previous girlfriend had a dance studio and he because delusional and started thinking that she was pimping out her students.  Patient has a hx of steroid abuse and is currently taking testosterone injections.  He also sees Dr Toy Care and he is prescribed Xanax, Celexa and Adderall.  Patient denies that he has been abusing Xanax, but states that he has taken Adderall on occasion more than prescribed.  He states that he has thought  that his Adderall could be contributing to his delusional thinking.  Patient is alert and oriented.  His thoughts are organized, but delusional.  He appear to be hypomanic.  His speech is pressured and he is hyper-verbal at times.His judgment, insight and impulse control are impaired.  He does not currently appear to be responding to any internal stimuli.  His eye contact is good.   How Long Has This Been Causing You Problems? 1 wk - 1 month  What Do You Feel Would Help You the Most Today? Treatment for Depression or other mood problem   Have You Recently Had Any Thoughts About Hurting Yourself? No  Are You Planning to Commit Suicide/Harm Yourself At This time? No   Have you Recently Had Thoughts About [place]? No  Are You Planning to Harm Someone at This Time? No  Explanation: No data recorded  Have You Used Any Alcohol or Drugs in the Past 24 Hours? Yes  How Long Ago Did You Use Drugs or Alcohol? No data recorded What Did You Use and How Much? last use was yesterday   Do You Currently Have a Therapist/Psychiatrist? No data recorded Name of Therapist/Psychiatrist: No data recorded  Have You Been Recently Discharged From Any Office Practice or Programs? No data recorded Explanation of Discharge From Practice/Program: No data recorded    CCA Screening Triage Referral Assessment Type of Contact: No data recorded Telemedicine Service Delivery:   Is this Initial or Reassessment? No data recorded Date Telepsych consult ordered in [facility]:  No data recorded Time Telepsych consult ordered in [facility]:  No data recorded Location of Assessment: No data recorded Provider Location: No data recorded  Collateral Involvement: No data recorded  Does Patient Have a [place]? No data recorded Legal Guardian Contact Information: No data recorded Copy of Legal Guardianship Form: No data recorded Legal Guardian Notified of Arrival: No data recorded Legal  Guardian Notified of Pending Discharge: No data recorded If Minor and Not Living with Parent(s), Who has Custody? No data recorded Is CPS involved or ever been involved? No data recorded Is APS involved or ever been involved? No data recorded  Patient Determined To Be At Risk for Harm To Self or Others Based on Review of Patient Reported Information or Presenting Complaint? No data recorded Method: No data recorded Availability of Means: No data recorded Intent: No data recorded Notification Required: No data recorded Additional Information for Danger to Others Potential: No data recorded Additional Comments for Danger to Others Potential: No data recorded Are There Guns or Other Weapons in Your Home? No data recorded Types of Guns/Weapons: No data recorded Are These Weapons Safely Secured?                            No data recorded Who Could Verify You Are Able To Have These Secured: No data recorded Do You Have any Outstanding Charges, Pending Court Dates, Parole/Probation? No data recorded Contacted To Inform of Risk of Harm  To Self or Others: No data recorded   Does Patient Present under Involuntary Commitment? No data recorded IVC Papers Initial File Date: No data recorded  [place] of Residence: No data recorded  Patient Currently Receiving the Following Services: No data recorded  Determination of Need: Urgent (48 hours)   Options For Referral: [facility]; Medication Management     CCA Biopsychosocial Patient Reported Schizophrenia/Schizoaffective Diagnosis in Past: No   Strengths: Patient recently became a fireman   Mental Health Symptoms Depression:   Sleep (too much or little)   Duration of Depressive symptoms:  Duration of Depressive Symptoms: Greater than two weeks   Mania:   Change in energy/activity; Increased Energy; Racing thoughts   Anxiety:    Difficulty concentrating; Restlessness; Tension; Worrying   Psychosis:   Delusions   Duration of  Psychotic symptoms:  Duration of Psychotic Symptoms: Less than six months   Trauma:   None   Obsessions:   Disrupts routine/functioning; Intrusive/time consuming; Recurrent & persistent thoughts/impulses/images   Compulsions:   "Driven" to perform behaviors/acts; Disrupts with routine/functioning; Absent insight/delusional   Inattention:  No data recorded  Hyperactivity/Impulsivity:   None   Oppositional/Defiant Behaviors:   None   Emotional Irregularity:   Intense/unstable relationships   Other Mood/Personality Symptoms:   depressed, anxious and delusional    Mental Status Exam Appearance and self-care  Stature:   Average   Weight:   Average weight   Clothing:   Casual; Neat/clean   Grooming:   Well-groomed   Cosmetic use:   None   Posture/gait:   Normal   Motor activity:   Not Remarkable; Restless   Sensorium  Attention:   Distractible   Concentration:   Anxiety interferes; Focuses on irrelevancies; Preoccupied   Orientation:   Object; Person; Place; Time; Situation   Recall/memory:   Normal   Affect and Mood  Affect:  No data recorded  Mood:   Anxious; Depressed; Hypomania   Relating  Eye contact:   Normal   Facial expression:   Depressed; Anxious   Attitude toward examiner:   Cooperative   Thought and Language  Speech flow:  Clear and Coherent; Pressured   Thought content:   Delusions; Ideas of Influence   Preoccupation:   Ruminations; Obsessions   Hallucinations:   Visual; Auditory   Organization:  No data recorded  [employer] of Knowledge:   Good   Intelligence:   Above Average   Abstraction:   Normal   Judgement:   Impaired   Reality Testing:   Distorted   Insight:   Lacking   Decision Making:   Impulsive   Social Functioning  Social Maturity:   Responsible   Social Judgement:   Normal   Stress  Stressors:   Family conflict; Transitions   Coping Ability:   Overwhelmed    Skill Deficits:   Decision making; Interpersonal   Supports:   Family; Friends/Service system     Religion: Religion/Spirituality Are You A Religious Person?:  (not assessed)  Leisure/Recreation: Leisure / Recreation Do You Have Hobbies?: Yes Leisure and Hobbies: working out at [employer], running  Exercise/Diet: Exercise/Diet Do You Exercise?: Yes What Type of Exercise Do You Do?: [employer], Run/Walk How Many Times a Week Do You Exercise?: 4-5 times a week Have You Gained or Lost A Significant Amount of Weight in the Past Six Months?: No Do You Follow a Special Diet?: No Do You Have Any Trouble Sleeping?: Yes Explanation of Sleeping Difficulties:  not sleeping well   CCA Employment/Education Employment/Work Situation: Employment / Work Situation Employment Situation: Employed Work Stressors: none reported  Education: Education Is Patient Currently Attending School?: No Last Grade Completed: 12 Did You Product manager?: Yes What Type of College Degree Do you Have?: Bachelor's Did You Have An Individualized Education Program (IIEP): No Patient's Education Has Been Impacted by Current Illness: No   CCA Family/Childhood History Family and Relationship History: Family history Marital status: Separated Separated, when?: within the past two years What types of issues is patient dealing with in the relationship?: co-parenting Does patient have children?: Yes How many children?: 2 How is patient's relationship with their children?: patient states that his children are his number 1 priority  Childhood History:  Childhood History By whom was/is the patient raised?: Both parents Did patient suffer any verbal/emotional/physical/sexual abuse as a child?: No Has patient ever been sexually abused/assaulted/raped as an adolescent or adult?: No Was the patient ever a victim of a crime or a disaster?: No Witnessed domestic violence?: No Has patient been affected by  domestic violence as an adult?: No  Child/Adolescent Assessment:     CCA Substance Use Alcohol/Drug Use: Alcohol / Drug Use Pain Medications: see MAR Prescriptions: see MAR Over the Counter: see MAR History of alcohol / drug use?: No history of alcohol / drug abuse                         ASAM's:  Six Dimensions of Multidimensional Assessment  Dimension 1:  Acute Intoxication and/or Withdrawal Potential:      Dimension 2:  Biomedical Conditions and Complications:      Dimension 3:  Emotional, Behavioral, or Cognitive Conditions and Complications:     Dimension 4:  Readiness to Change:     Dimension 5:  Relapse, Continued use, or Continued Problem Potential:     Dimension 6:  Recovery/Living Environment:     ASAM Severity Score:    ASAM Recommended Level of Treatment:     Substance use Disorder (SUD)    Recommendations for Services/Supports/Treatments:    Discharge Disposition:    DSM5 Diagnoses: Patient Active Problem List   Diagnosis Date Noted   Psychosis (HCC)    Subarachnoid hemorrhage following injury, no loss of consciousness (HCC) 02/16/2021   Closed fracture of transverse process of cervical vertebra (HCC) 02/16/2021   Elevated transaminase level 01/22/2016   GERD (gastroesophageal reflux disease) 01/21/2016   Intractable hiccups 01/21/2016   Depression 01/21/2016   Insomnia 01/21/2016   Asthma    Hyperlipidemia 08/18/2013   Essential hypertension 08/18/2013   Abnormal glucose 08/18/2013   Vitamin D deficiency    ADD (attention deficit disorder)    Testosterone Deficiency 09/06/2011     Referrals to Alternative Service(s): Referred to Alternative Service(s):   Place:   Date:   Time:    Referred to Alternative Service(s):   Place:   Date:   Time:    Referred to Alternative Service(s):   Place:   Date:   Time:    Referred to Alternative Service(s):   Place:   Date:   Time:     Zykeem Bauserman J Betty Brooks, LCAS

## 2022-01-05 NOTE — ED Provider Notes (Signed)
[facility]gent [facility] Continuous Assessment Admission H&P  Date: 01/05/22 Patient Name: Darren Freeman MRN: [id number] Chief Complaint:  Chief Complaint  Patient presents with   Delusional      Diagnoses:  Final diagnoses:  Paranoia (psychosis) [facility])    HPI: Patient presents voluntarily to [facility] behavioral health for walk-in assessment.  Patient is accompanied by his mother, [name]. Patient refers that his mother remain present during assessment. Patient is assessed, face-to-face, by nurse practitioner, seated in assessment area, no acute distress.  He  is alert and oriented, pleasant and cooperative during assessment.   Burlie presents with rapid and pressured speech, tangential in nature.  He states "it started 2 to 3 months ago when people began turning around in my driveway, every car would have the hazard lights on and driving creepy slow, that is when I realized that my neighbor is a felon and potentially connected to an organization that is a front for white supremacy."  Patient states "it is way too many coincidences not to be mathematically an anomaly."  He reports apparent paranoid delusions.  He states " about one month ago my girlfriend left me and I had a gut feeling so I parked somewhere else and walked around to the back. I saw her in her neighbors yard, having a party, they had glow in the dark dildos and I do not know what they were doing, I assume it was meth, in the backyard of the neighbors house, I have a video. In the video, when I watch it slowly, I can see 3 men behind me with hoods on, so I began googling the [facility] and prostitution rings and I think that could be what is going on."  [name] believes his phone is tapped and his house is tapped. He does not want to return to his home as he is positive that someone was in his house looking at is phone, he saw the reflection of this person in a window scrolling though his phone. After the person looked into his  phone they placed the phone back where Nameer left the phone and then were gone from the home.   [name] reports history of depression and ADHD. He is followed by Dr Milagros Evener who prescribes adderall, celexa and alprazolam. He is not confident in current outpatient psychiatry, states "she does not even talk to me or ask me how I am doing." Not linked with outpatient counseling.   He denies history of inpatient psychiatric hospitalization.  Family mental health history includes patient's mother who has been diagnosed with depression.  [name] reports last dose of Adderall approximately one month ago. He states "I have had a long battle with Adderall and I know that it its not good for me."  He reports rare use of alprazolam, last dose of alprazolam 3 days ago when he took 1mg  BID.  Patient reports he also uses testosterone injections that he purchases online 2 times per week for a total of 500 mg weekly. Last dose testerone 3 days ago.  He has diagnosis of low testosterone, previously managed by primary care provider but felt "the dose was not enough and not often enough."   Patient  presents with anxious mood, congruent affect. He  denies suicidal and homicidal ideations. Denies history of suicide attempts, denies history of nonsuicidal self-harm behavior.  He  denies auditory and visual hallucinations.    Patient endorses rare alcohol use. Consumes an average of 2-3 twelve ounce beers an  average of once monthly.He uses CBD gummies and CBD vape pen daily.    [name] currently resides in [place].  He denies access to weapons.  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  Patient endorses average appetite.  Patient offered support and encouragement.  He verbalizes agreement with plan to include inpatient psychiatric hospitalization. Reviewed medications including olanzapine. Reviewed side effects, patient offered opportunity to ask questions.    Patient's mother, [name], agrees with plan for inpatient psychiatric admission.  PHQ 2-9:  [employer] Visit from 06/07/2021 in [facility] from 02/09/2021 in [facility] from 09/28/2020 in [facility]  Thoughts that you would be better off dead, or of hurting yourself in some way Not at all Not at all Several days  PHQ-9 Total Score 0 2 20         Total Time spent with patient: 45 minutes  Musculoskeletal  Strength & Muscle Tone: within normal limits Gait & Station: normal Patient leans: N/A  Psychiatric Specialty Exam  Presentation General Appearance: Appropriate for Environment; Casual  Eye Contact:Good  Speech:Clear and Coherent; Normal Rate  Speech Volume:Normal  Handedness:Right   Mood and Affect  Mood:Anxious  Affect:Appropriate; Congruent   Thought Process  Thought Processes:Coherent; Goal Directed  Descriptions of Associations:Tangential  Orientation:Full (Time, Place and Person)  Thought Content:Tangential; Paranoid Ideation  Diagnosis of Schizophrenia or Schizoaffective disorder in past: No  Duration of Psychotic Symptoms: Less than six months  Hallucinations:Hallucinations: None  Ideas of Reference:Paranoia  Suicidal Thoughts:Suicidal Thoughts: No  Homicidal Thoughts:Homicidal Thoughts: No   Sensorium  Memory:Immediate Good; Recent Fair  Judgment:Impaired  Insight:Lacking   Executive Functions  Concentration:Fair  Attention Span:Poor  Recall:Fair  Fund of Knowledge:Good  Language:Good   Psychomotor Activity  Psychomotor Activity:Psychomotor Activity: Normal   Assets  Assets:Communication Skills; Desire for Improvement; Financial Resources/Insurance; Housing; Intimacy; Leisure Time; Physical Health; Resilience; Social Support   Sleep  Sleep:Sleep: Poor   Nutritional Assessment (For OBS and FBC admissions only) Has  the patient had a weight loss or gain of 10 pounds or more in the last 3 months?: No Has the patient had a decrease in food intake/or appetite?: No Does the patient have dental problems?: No Does the patient have eating habits or behaviors that may be indicators of an eating disorder including binging or inducing vomiting?: No Has the patient recently lost weight without trying?: 0 Has the patient been eating poorly because of a decreased appetite?: 0 Malnutrition Screening Tool Score: 0    Physical Exam Vitals and nursing note reviewed.  Constitutional:      Appearance: Normal appearance. He is well-developed and normal weight.  HENT:     Head: Normocephalic and atraumatic.     Nose: Nose normal.  Cardiovascular:     Rate and Rhythm: Normal rate.  Pulmonary:     Effort: Pulmonary effort is normal.  Musculoskeletal:        General: Normal range of motion.     Cervical back: Normal range of motion.     Comments: Right arm in sling- right shoulder surgical repair 2 weeks ago  Skin:    General: Skin is warm and dry.  Neurological:     Mental Status: He is alert and oriented to person, place, and time.  Psychiatric:        Attention and Perception: Attention normal.        Mood and  Affect: Mood is anxious. Affect is inappropriate.        Speech: Speech is rapid and pressured and tangential.        Behavior: Behavior is cooperative.        Thought Content: Thought content is paranoid and delusional.    Review of Systems  Constitutional: Negative.   HENT: Negative.    Eyes: Negative.   Respiratory: Negative.    Cardiovascular: Negative.   Gastrointestinal: Negative.   Genitourinary: Negative.   Musculoskeletal: Negative.   Skin: Negative.   Neurological: Negative.   Psychiatric/Behavioral:  The patient is nervous/anxious and has insomnia.     Blood pressure (!) 133/90, pulse 64, temperature 98 F (36.7 C), temperature source Oral, resp. rate 18, SpO2 100 %. There is no  height or weight on file to calculate BMI.  Past Psychiatric History: ADHD, depression  Is the patient at risk to self? No  Has the patient been a risk to self in the past 6 months? No .    Has the patient been a risk to self within the distant past? No   Is the patient a risk to others? No   Has the patient been a risk to others in the past 6 months? No   Has the patient been a risk to others within the distant past? No   Past Medical History:  Past Medical History:  Diagnosis Date   ADD (attention deficit disorder)    Anxiety    Asthma    ? allergy related. can run and no issues. sometimes wheeze or SOB after lifting or sex   Depression    Elevated LFTs    GERD (gastroesophageal reflux disease)    Glucose intolerance (impaired glucose tolerance)    previously on metformin   HTN (hypertension)    Hypogonadism male    Low testosterone    Vitamin D deficiency     Past Surgical History:  Procedure Laterality Date   penile surgery as child- thin urethra      Family History:  Family History  Problem Relation Age of Onset   Breast cancer Paternal Grandmother    Depression Mother        behavioral health admissions 3-4x   Hyperlipidemia Father    Hypertension Father    Depression Sister    Depression Brother    Diabetes Paternal Grandfather    Colon cancer Neg Hx     Social History:  Social History   Socioeconomic History   Marital status: Single    Spouse name: Not on file   Number of children: 0   Years of education: Not on file   Highest education level: Not on file  Occupational History   Occupation: Freight forwarder [employer]    Employer: [employer]  Tobacco Use   Smoking status: Former    Types: Cigarettes    Quit date: 07/30/2001    Years since quitting: 20.4   Smokeless tobacco: Never  Substance and Sexual Activity   Alcohol use: No   Drug use: Yes    Types: Marijuana    Comment: previously used cocaine (quit x 2 years)   Sexual activity:  Yes  Other Topics Concern   Not on file  Social History Narrative   Separated march 2022 (wife fell for another person). married November 2018. Alvis Lemmings (born December 2016), another child  2018.       Working at Research officer, trade union and working on [employer]   Prior to [employer].  Former Astronomer at [employer]      Hobbies: workout, concerts   Dad in town to [place]      Social Determinants of Health   Financial Resource Strain: Not on [employer] Insecurity: Not on file  Transportation Needs: Not on file  Physical Activity: Not on file  Stress: Not on file  Social Connections: Not on file  Intimate Partner Violence: Not on file    SDOH:  SDOH Screenings   Depression (PHQ2-9): Low Risk  (09/23/2021)  Tobacco Use: Medium Risk (09/23/2021)    Last Labs:  No visits with results within 6 Month(s) from this visit.  Latest known visit with results is:  Office Visit on 03/22/2016  Component Date Value Ref Range Status   Culture 03/22/2016 Moderate STAPHYLOCOCCUS AUREUS   Final   Gram Stain 03/22/2016 No WBC Seen   Final   Gram Stain 03/22/2016 Rare Squamous Epithelial Cells Present   Final   Gram Stain 03/22/2016 No Organisms Seen   Final   Organism ID, Bacteria 03/22/2016 STAPHYLOCOCCUS AUREUS   Final   Comment: Rifampin and Gentamicin should not be used as single drugs for treatment of Staph infections. This organism is presumed to be Clindamycin resistant based on detection of inducible Clindamycin resistance.     Allergies: Patient has no known allergies.  PTA Medications: (Not in a hospital admission)   Medical Decision Making  Patient reviewed with Dr. Nelly Rout.  Patient remains voluntary, inpatient psychiatric hospitalization recommended.  Laboratory studies ordered including CBC, CMP, ethanol, A1c, lipid panel, magnesium, prolactin and TSH.  Urine drug screen order initiated.  EKG ordered.  Current medications: -Acetaminophen  650 mg every 6 as needed/mild pain -Maalox 30 mL oral every 4 as needed/digestion -Hydroxyzine 25 mg 3 times daily as needed/anxiety -Magnesium hydroxide 30 mL daily as needed/mild constipation -Trazodone 50 mg nightly as needed/sleep -olanzapine zydis 5mg  QHS/mood     Recommendations  Based on my evaluation the patient does not appear to have an emergency medical condition.  , FNP 01/05/22  3:56 PM

## 2022-01-05 NOTE — Progress Notes (Signed)
Received Altamese Dilling on the unit after the skin assessment was completed. He was oriented to his new environment and offered nourishments.He endorsed feeling anxious and depressed. He is wearing a right arm sling related to a recent surgery.

## 2022-01-05 NOTE — ED Notes (Signed)
Pt sleeping. Will continue to monitor for safety. Resp even and unlabored.

## 2022-01-06 DIAGNOSIS — J309 Allergic rhinitis, unspecified: Secondary | ICD-10-CM | POA: Diagnosis not present

## 2022-01-06 DIAGNOSIS — R69 Illness, unspecified: Secondary | ICD-10-CM | POA: Diagnosis not present

## 2022-01-06 DIAGNOSIS — Z5900 Homelessness unspecified: Secondary | ICD-10-CM | POA: Diagnosis not present

## 2022-01-06 DIAGNOSIS — F419 Anxiety disorder, unspecified: Secondary | ICD-10-CM | POA: Diagnosis not present

## 2022-01-06 DIAGNOSIS — S29011A Strain of muscle and tendon of front wall of thorax, initial encounter: Secondary | ICD-10-CM | POA: Diagnosis not present

## 2022-01-06 DIAGNOSIS — J45909 Unspecified asthma, uncomplicated: Secondary | ICD-10-CM | POA: Diagnosis not present

## 2022-01-06 DIAGNOSIS — F332 Major depressive disorder, recurrent severe without psychotic features: Secondary | ICD-10-CM | POA: Diagnosis not present

## 2022-01-06 DIAGNOSIS — F909 Attention-deficit hyperactivity disorder, unspecified type: Secondary | ICD-10-CM | POA: Diagnosis not present

## 2022-01-06 DIAGNOSIS — F900 Attention-deficit hyperactivity disorder, predominantly inattentive type: Secondary | ICD-10-CM | POA: Diagnosis not present

## 2022-01-06 DIAGNOSIS — R45851 Suicidal ideations: Secondary | ICD-10-CM | POA: Diagnosis not present

## 2022-01-06 NOTE — Progress Notes (Signed)
Pt was accepted to [facility] 01/06/22; [facility] unit  Pt meets inpatient criteria per Glori Luis, FNP  Attending Physician will be Dr. Lavella Lemons  Report can be called to: [phone]  Pt can arrive after: Speaking with nursing  Care Team notified: Beatriz Stallion, FNP, Roxy Manns, RN, [facility] Lynnda Shields, RN  [place], LCSWA 01/06/2022 @ 9:45 AM

## 2022-01-06 NOTE — ED Notes (Signed)
Call placed to [facility] phone number provided at [phone]. I first spoke with Mendel Ryder and was connected to [facility]. Report given, received, and confirmed. Directed that after report was received, he was approved for arrival. Patient's Transportation was arranged with [facility] to [facility] and patient was informed of pending transport.

## 2022-01-06 NOTE — ED Notes (Signed)
Patient was assessed in bed/chair on unit resting under covers alert and oriented with a flat affect. He denies experiencing any anxiety. He states that he feels "A little bummed out this morning" but denies any thoughts/plan/intent to harm himself. Breakfast and Fluids offered/encouraged. He consumed a muffin and fruit this AM. He denies auditory/visual hallucinations at this time.
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
## 2022-01-06 NOTE — Progress Notes (Signed)
Inpatient Behavioral Health Placement  Pt meets inpatient criteria per Tina Allen,FNP. There are no available beds at [facility] per [facility] Lynnda Shields, RN.  Referral was sent to the following facilities;    Destination Service Provider Address Phone Fax  [facility]  [place], [place] [address] [phone]  [facility]  [address] [place] [address] [phone]  [facility]  [address] [place] [address] [phone]  [facility]  [place], [place] [address] [phone] [facility]  [address] [place], [place] [address] [phone] [facility]  [place] [address] [place] [address] [place]  [facility]  [address] [place] [address] [phone]  [facility]  [address] [place] [phone]  [place] [address] [place] [address] [phone]  [facility]  [place] [address] [phone] [place]  [address], [place] [address] [phone] [facility]  [address] [place] [address] [phone] [place]  [address], [place] [address] [phone] [facility]  [address] [place] [address] [phone]  [facility]  [address] [place] [address]     Situation ongoing,  CSW will follow up.   Benjaman Kindler, MSW, LCSWA 01/06/2022  @ 9:32 AM

## 2022-01-06 NOTE — Progress Notes (Signed)
Darren Freeman was discharged via Safe transport to [facility].

## 2022-01-06 NOTE — ED Notes (Signed)
Pt sleeping at present, no distress noted.  Monitoring for safety. 

## 2022-01-07 LAB — PROLACTIN: Prolactin: 8 ng/mL (ref 4.0–15.2)

## 2022-01-20 ENCOUNTER — Other Ambulatory Visit (HOSPITAL_BASED_OUTPATIENT_CLINIC_OR_DEPARTMENT_OTHER): Payer: Self-pay

## 2022-01-26 ENCOUNTER — Other Ambulatory Visit (HOSPITAL_BASED_OUTPATIENT_CLINIC_OR_DEPARTMENT_OTHER): Payer: Self-pay

## 2022-01-26 MED ORDER — AMPHETAMINE-DEXTROAMPHETAMINE 20 MG PO TABS
20.0000 mg | ORAL_TABLET | Freq: Two times a day (BID) | ORAL | 0 refills | Status: DC
  Filled 2022-01-26: qty 60, 30d supply, fill #0

## 2022-01-27 ENCOUNTER — Other Ambulatory Visit (HOSPITAL_BASED_OUTPATIENT_CLINIC_OR_DEPARTMENT_OTHER): Payer: Self-pay

## 2022-01-28 ENCOUNTER — Other Ambulatory Visit (HOSPITAL_BASED_OUTPATIENT_CLINIC_OR_DEPARTMENT_OTHER): Payer: Self-pay

## 2022-02-22 ENCOUNTER — Other Ambulatory Visit (HOSPITAL_BASED_OUTPATIENT_CLINIC_OR_DEPARTMENT_OTHER): Payer: Self-pay

## 2022-02-22 DIAGNOSIS — F410 Panic disorder [episodic paroxysmal anxiety] without agoraphobia: Secondary | ICD-10-CM | POA: Diagnosis not present

## 2022-02-22 DIAGNOSIS — F900 Attention-deficit hyperactivity disorder, predominantly inattentive type: Secondary | ICD-10-CM | POA: Diagnosis not present

## 2022-02-22 MED ORDER — AMPHETAMINE-DEXTROAMPHETAMINE 20 MG PO TABS: 20.0000 mg | ORAL_TABLET | Freq: Two times a day (BID) | ORAL | 0 refills | Status: DC

## 2022-02-23 ENCOUNTER — Encounter: Payer: 59 | Admitting: Family Medicine

## 2022-02-23 ENCOUNTER — Other Ambulatory Visit (HOSPITAL_COMMUNITY): Payer: Self-pay

## 2022-02-23 ENCOUNTER — Encounter (HOSPITAL_BASED_OUTPATIENT_CLINIC_OR_DEPARTMENT_OTHER): Payer: Self-pay | Admitting: Pharmacist

## 2022-02-23 ENCOUNTER — Other Ambulatory Visit (HOSPITAL_BASED_OUTPATIENT_CLINIC_OR_DEPARTMENT_OTHER): Payer: Self-pay

## 2022-02-23 MED ORDER — AMPHETAMINE-DEXTROAMPHETAMINE 20 MG PO TABS
20.0000 mg | ORAL_TABLET | Freq: Three times a day (TID) | ORAL | 0 refills | Status: DC
  Filled 2022-02-23: qty 90, 30d supply, fill #0

## 2022-02-27 ENCOUNTER — Other Ambulatory Visit (HOSPITAL_BASED_OUTPATIENT_CLINIC_OR_DEPARTMENT_OTHER): Payer: Self-pay

## 2022-03-01 DIAGNOSIS — C44519 Basal cell carcinoma of skin of other part of trunk: Secondary | ICD-10-CM | POA: Diagnosis not present

## 2022-03-01 DIAGNOSIS — Z85828 Personal history of other malignant neoplasm of skin: Secondary | ICD-10-CM | POA: Diagnosis not present

## 2022-03-01 DIAGNOSIS — D2261 Melanocytic nevi of right upper limb, including shoulder: Secondary | ICD-10-CM | POA: Diagnosis not present

## 2022-03-01 DIAGNOSIS — C44719 Basal cell carcinoma of skin of left lower limb, including hip: Secondary | ICD-10-CM | POA: Diagnosis not present

## 2022-03-10 ENCOUNTER — Encounter: Payer: Self-pay | Admitting: Family Medicine

## 2022-03-10 DIAGNOSIS — Z85828 Personal history of other malignant neoplasm of skin: Secondary | ICD-10-CM | POA: Insufficient documentation

## 2022-03-23 ENCOUNTER — Encounter: Payer: Self-pay | Admitting: *Deleted

## 2022-03-24 ENCOUNTER — Other Ambulatory Visit (HOSPITAL_BASED_OUTPATIENT_CLINIC_OR_DEPARTMENT_OTHER): Payer: Self-pay

## 2022-03-24 MED ORDER — AMPHETAMINE-DEXTROAMPHETAMINE 20 MG PO TABS
20.0000 mg | ORAL_TABLET | Freq: Three times a day (TID) | ORAL | 0 refills | Status: DC
  Filled 2022-03-25: qty 90, 30d supply, fill #0

## 2022-03-25 ENCOUNTER — Other Ambulatory Visit (HOSPITAL_BASED_OUTPATIENT_CLINIC_OR_DEPARTMENT_OTHER): Payer: Self-pay

## 2022-03-27 ENCOUNTER — Other Ambulatory Visit (HOSPITAL_BASED_OUTPATIENT_CLINIC_OR_DEPARTMENT_OTHER): Payer: Self-pay

## 2022-03-27 MED ORDER — ALPRAZOLAM 1 MG PO TABS
1.0000 mg | ORAL_TABLET | Freq: Three times a day (TID) | ORAL | 2 refills | Status: DC | PRN
  Filled 2022-03-27: qty 90, 30d supply, fill #0
  Filled 2022-04-27 (×2): qty 90, 30d supply, fill #1
  Filled 2022-05-25 – 2022-06-02 (×3): qty 90, 30d supply, fill #2

## 2022-04-06 DIAGNOSIS — Z20822 Contact with and (suspected) exposure to covid-19: Secondary | ICD-10-CM | POA: Diagnosis not present

## 2022-04-06 DIAGNOSIS — R059 Cough, unspecified: Secondary | ICD-10-CM | POA: Diagnosis not present

## 2022-04-06 DIAGNOSIS — J00 Acute nasopharyngitis [common cold]: Secondary | ICD-10-CM | POA: Diagnosis not present

## 2022-04-24 ENCOUNTER — Other Ambulatory Visit (HOSPITAL_BASED_OUTPATIENT_CLINIC_OR_DEPARTMENT_OTHER): Payer: Self-pay

## 2022-04-24 MED ORDER — AMPHETAMINE-DEXTROAMPHETAMINE 20 MG PO TABS
20.0000 mg | ORAL_TABLET | Freq: Three times a day (TID) | ORAL | 0 refills | Status: DC
  Filled 2022-04-24: qty 90, 30d supply, fill #0

## 2022-04-27 ENCOUNTER — Other Ambulatory Visit (HOSPITAL_BASED_OUTPATIENT_CLINIC_OR_DEPARTMENT_OTHER): Payer: Self-pay

## 2022-04-27 ENCOUNTER — Other Ambulatory Visit: Payer: Self-pay

## 2022-05-22 ENCOUNTER — Other Ambulatory Visit (HOSPITAL_BASED_OUTPATIENT_CLINIC_OR_DEPARTMENT_OTHER): Payer: Self-pay

## 2022-05-22 MED ORDER — AMPHETAMINE-DEXTROAMPHETAMINE 20 MG PO TABS
20.0000 mg | ORAL_TABLET | Freq: Three times a day (TID) | ORAL | 0 refills | Status: DC
  Filled 2022-05-24: qty 90, 30d supply, fill #0

## 2022-05-24 ENCOUNTER — Other Ambulatory Visit (HOSPITAL_BASED_OUTPATIENT_CLINIC_OR_DEPARTMENT_OTHER): Payer: Self-pay

## 2022-05-26 ENCOUNTER — Other Ambulatory Visit: Payer: Self-pay

## 2022-05-26 ENCOUNTER — Other Ambulatory Visit (HOSPITAL_BASED_OUTPATIENT_CLINIC_OR_DEPARTMENT_OTHER): Payer: Self-pay

## 2022-06-01 ENCOUNTER — Other Ambulatory Visit (HOSPITAL_BASED_OUTPATIENT_CLINIC_OR_DEPARTMENT_OTHER): Payer: Self-pay

## 2022-06-01 ENCOUNTER — Encounter (HOSPITAL_BASED_OUTPATIENT_CLINIC_OR_DEPARTMENT_OTHER): Payer: Self-pay

## 2022-06-02 ENCOUNTER — Other Ambulatory Visit (HOSPITAL_BASED_OUTPATIENT_CLINIC_OR_DEPARTMENT_OTHER): Payer: Self-pay

## 2022-06-21 ENCOUNTER — Other Ambulatory Visit (HOSPITAL_COMMUNITY): Payer: Self-pay

## 2022-06-21 ENCOUNTER — Emergency Department (HOSPITAL_BASED_OUTPATIENT_CLINIC_OR_DEPARTMENT_OTHER)
Admission: EM | Admit: 2022-06-21 | Discharge: 2022-06-21 | Disposition: A | Discharge status: Home / Self Care | Payer: BC Managed Care – PPO | Attending: Emergency Medicine | Admitting: Emergency Medicine

## 2022-06-21 ENCOUNTER — Other Ambulatory Visit (HOSPITAL_BASED_OUTPATIENT_CLINIC_OR_DEPARTMENT_OTHER): Payer: Self-pay

## 2022-06-21 ENCOUNTER — Other Ambulatory Visit: Payer: Self-pay

## 2022-06-21 ENCOUNTER — Encounter (HOSPITAL_BASED_OUTPATIENT_CLINIC_OR_DEPARTMENT_OTHER): Payer: Self-pay | Admitting: Emergency Medicine

## 2022-06-21 ENCOUNTER — Emergency Department (HOSPITAL_BASED_OUTPATIENT_CLINIC_OR_DEPARTMENT_OTHER): Payer: BC Managed Care – PPO | Admitting: Radiology

## 2022-06-21 DIAGNOSIS — Y990 Civilian activity done for income or pay: Secondary | ICD-10-CM | POA: Diagnosis not present

## 2022-06-21 DIAGNOSIS — W2209XA Striking against other stationary object, initial encounter: Secondary | ICD-10-CM | POA: Insufficient documentation

## 2022-06-21 DIAGNOSIS — S7012XA Contusion of left thigh, initial encounter: Secondary | ICD-10-CM | POA: Diagnosis not present

## 2022-06-21 DIAGNOSIS — Y9355 Activity, bike riding: Secondary | ICD-10-CM | POA: Insufficient documentation

## 2022-06-21 DIAGNOSIS — S79922A Unspecified injury of left thigh, initial encounter: Secondary | ICD-10-CM | POA: Diagnosis not present

## 2022-06-21 DIAGNOSIS — M7989 Other specified soft tissue disorders: Secondary | ICD-10-CM | POA: Diagnosis not present

## 2022-06-21 MED ORDER — OXYCODONE-ACETAMINOPHEN 5-325 MG PO TABS
1.0000 | ORAL_TABLET | Freq: Three times a day (TID) | ORAL | 0 refills | Status: DC | PRN
  Filled 2022-06-21: qty 6, 1d supply, fill #0

## 2022-06-21 MED ORDER — METHOCARBAMOL 500 MG PO TABS
500.0000 mg | ORAL_TABLET | Freq: Three times a day (TID) | ORAL | 0 refills | Status: DC | PRN
  Filled 2022-06-21: qty 8, 3d supply, fill #0

## 2022-06-21 MED ORDER — AMPHETAMINE-DEXTROAMPHETAMINE 20 MG PO TABS
20.0000 mg | ORAL_TABLET | Freq: Three times a day (TID) | ORAL | 0 refills | Status: DC
  Filled 2022-06-22: qty 90, 30d supply, fill #0

## 2022-06-21 NOTE — Discharge Instructions (Addendum)
Follow-up with your doctor or orthopedic surgery if symptoms worsen.
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
## 2022-06-22 ENCOUNTER — Other Ambulatory Visit (HOSPITAL_BASED_OUTPATIENT_CLINIC_OR_DEPARTMENT_OTHER): Payer: Self-pay

## 2022-06-24 ENCOUNTER — Other Ambulatory Visit (HOSPITAL_BASED_OUTPATIENT_CLINIC_OR_DEPARTMENT_OTHER): Payer: Self-pay

## 2022-06-26 ENCOUNTER — Other Ambulatory Visit (HOSPITAL_BASED_OUTPATIENT_CLINIC_OR_DEPARTMENT_OTHER): Payer: Self-pay

## 2022-06-26 MED ORDER — ALPRAZOLAM 1 MG PO TABS
1.0000 mg | ORAL_TABLET | Freq: Three times a day (TID) | ORAL | 2 refills | Status: DC | PRN
  Filled 2022-07-08 (×2): qty 90, 30d supply, fill #0
  Filled 2022-08-01 – 2022-08-05 (×4): qty 90, 30d supply, fill #1
  Filled 2022-09-06: qty 90, 30d supply, fill #2

## 2022-07-08 ENCOUNTER — Other Ambulatory Visit (HOSPITAL_BASED_OUTPATIENT_CLINIC_OR_DEPARTMENT_OTHER): Payer: Self-pay

## 2022-07-20 ENCOUNTER — Other Ambulatory Visit (HOSPITAL_BASED_OUTPATIENT_CLINIC_OR_DEPARTMENT_OTHER): Payer: Self-pay

## 2022-07-20 MED ORDER — AMPHETAMINE-DEXTROAMPHETAMINE 20 MG PO TABS
20.0000 mg | ORAL_TABLET | Freq: Three times a day (TID) | ORAL | 0 refills | Status: DC
  Filled 2022-07-22 (×2): qty 90, 30d supply, fill #0

## 2022-07-22 ENCOUNTER — Other Ambulatory Visit (HOSPITAL_BASED_OUTPATIENT_CLINIC_OR_DEPARTMENT_OTHER): Payer: Self-pay

## 2022-08-01 ENCOUNTER — Other Ambulatory Visit (HOSPITAL_BASED_OUTPATIENT_CLINIC_OR_DEPARTMENT_OTHER): Payer: Self-pay

## 2022-08-01 ENCOUNTER — Other Ambulatory Visit: Payer: Self-pay

## 2022-08-02 ENCOUNTER — Other Ambulatory Visit: Payer: Self-pay

## 2022-08-02 ENCOUNTER — Other Ambulatory Visit (HOSPITAL_BASED_OUTPATIENT_CLINIC_OR_DEPARTMENT_OTHER): Payer: Self-pay

## 2022-08-02 MED ORDER — CITALOPRAM HYDROBROMIDE 40 MG PO TABS
40.0000 mg | ORAL_TABLET | Freq: Every day | ORAL | 0 refills | Status: DC
  Filled 2022-08-02: qty 30, 30d supply, fill #0

## 2022-08-05 ENCOUNTER — Other Ambulatory Visit (HOSPITAL_BASED_OUTPATIENT_CLINIC_OR_DEPARTMENT_OTHER): Payer: Self-pay

## 2022-08-08 ENCOUNTER — Ambulatory Visit: Payer: BC Managed Care – PPO | Admitting: Physician Assistant

## 2022-08-23 ENCOUNTER — Other Ambulatory Visit (HOSPITAL_BASED_OUTPATIENT_CLINIC_OR_DEPARTMENT_OTHER): Payer: Self-pay

## 2022-08-24 ENCOUNTER — Encounter (HOSPITAL_BASED_OUTPATIENT_CLINIC_OR_DEPARTMENT_OTHER): Payer: Self-pay | Admitting: Pharmacist

## 2022-08-24 ENCOUNTER — Other Ambulatory Visit (HOSPITAL_BASED_OUTPATIENT_CLINIC_OR_DEPARTMENT_OTHER): Payer: Self-pay

## 2022-09-06 ENCOUNTER — Other Ambulatory Visit (HOSPITAL_BASED_OUTPATIENT_CLINIC_OR_DEPARTMENT_OTHER): Payer: Self-pay

## 2022-09-06 MED ORDER — AMPHETAMINE-DEXTROAMPHETAMINE 20 MG PO TABS
20.0000 mg | ORAL_TABLET | Freq: Three times a day (TID) | ORAL | 0 refills | Status: DC
  Filled 2022-09-06: qty 18, 6d supply, fill #0

## 2022-09-06 MED ORDER — CITALOPRAM HYDROBROMIDE 40 MG PO TABS
40.0000 mg | ORAL_TABLET | Freq: Every day | ORAL | 0 refills | Status: DC
  Filled 2022-09-06: qty 30, 30d supply, fill #0

## 2022-09-07 ENCOUNTER — Other Ambulatory Visit: Payer: Self-pay

## 2022-09-11 ENCOUNTER — Other Ambulatory Visit (HOSPITAL_BASED_OUTPATIENT_CLINIC_OR_DEPARTMENT_OTHER): Payer: Self-pay

## 2022-09-11 DIAGNOSIS — F410 Panic disorder [episodic paroxysmal anxiety] without agoraphobia: Secondary | ICD-10-CM | POA: Diagnosis not present

## 2022-09-11 DIAGNOSIS — G2581 Restless legs syndrome: Secondary | ICD-10-CM | POA: Diagnosis not present

## 2022-09-11 DIAGNOSIS — F900 Attention-deficit hyperactivity disorder, predominantly inattentive type: Secondary | ICD-10-CM | POA: Diagnosis not present

## 2022-09-11 MED ORDER — GABAPENTIN 300 MG PO CAPS
300.0000 mg | ORAL_CAPSULE | Freq: Every day | ORAL | 5 refills | Status: DC
  Filled 2022-09-11: qty 90, 30d supply, fill #0
  Filled 2022-10-30: qty 90, 30d supply, fill #1

## 2022-09-11 MED ORDER — AMPHETAMINE-DEXTROAMPHETAMINE 20 MG PO TABS
20.0000 mg | ORAL_TABLET | Freq: Three times a day (TID) | ORAL | 0 refills | Status: DC
  Filled 2022-09-12: qty 60, 20d supply, fill #0

## 2022-09-12 ENCOUNTER — Other Ambulatory Visit (HOSPITAL_BASED_OUTPATIENT_CLINIC_OR_DEPARTMENT_OTHER): Payer: Self-pay

## 2022-09-12 ENCOUNTER — Other Ambulatory Visit: Payer: Self-pay

## 2022-10-02 ENCOUNTER — Other Ambulatory Visit (HOSPITAL_BASED_OUTPATIENT_CLINIC_OR_DEPARTMENT_OTHER): Payer: Self-pay

## 2022-10-02 MED ORDER — AMPHETAMINE-DEXTROAMPHETAMINE 20 MG PO TABS
20.0000 mg | ORAL_TABLET | Freq: Three times a day (TID) | ORAL | 0 refills | Status: DC
  Filled 2022-10-02: qty 90, 30d supply, fill #0

## 2022-10-04 ENCOUNTER — Other Ambulatory Visit (HOSPITAL_BASED_OUTPATIENT_CLINIC_OR_DEPARTMENT_OTHER): Payer: Self-pay

## 2022-10-04 MED ORDER — ALPRAZOLAM 1 MG PO TABS
1.0000 mg | ORAL_TABLET | Freq: Three times a day (TID) | ORAL | 2 refills | Status: DC | PRN
  Filled 2022-10-07 (×2): qty 90, 30d supply, fill #0
  Filled 2022-11-11: qty 90, 30d supply, fill #1

## 2022-10-07 ENCOUNTER — Other Ambulatory Visit (HOSPITAL_BASED_OUTPATIENT_CLINIC_OR_DEPARTMENT_OTHER): Payer: Self-pay

## 2022-10-30 ENCOUNTER — Other Ambulatory Visit (HOSPITAL_BASED_OUTPATIENT_CLINIC_OR_DEPARTMENT_OTHER): Payer: Self-pay

## 2022-10-30 ENCOUNTER — Other Ambulatory Visit: Payer: Self-pay

## 2022-10-30 MED ORDER — CITALOPRAM HYDROBROMIDE 40 MG PO TABS
40.0000 mg | ORAL_TABLET | Freq: Every day | ORAL | 4 refills | Status: DC
  Filled 2022-10-30: qty 30, 30d supply, fill #0

## 2022-10-30 MED ORDER — AMPHETAMINE-DEXTROAMPHETAMINE 20 MG PO TABS
20.0000 mg | ORAL_TABLET | Freq: Three times a day (TID) | ORAL | 0 refills | Status: DC
  Filled 2022-11-01: qty 90, 30d supply, fill #0

## 2022-10-31 ENCOUNTER — Other Ambulatory Visit (HOSPITAL_BASED_OUTPATIENT_CLINIC_OR_DEPARTMENT_OTHER): Payer: Self-pay

## 2022-11-01 ENCOUNTER — Other Ambulatory Visit (HOSPITAL_BASED_OUTPATIENT_CLINIC_OR_DEPARTMENT_OTHER): Payer: Self-pay

## 2022-11-11 ENCOUNTER — Other Ambulatory Visit (HOSPITAL_BASED_OUTPATIENT_CLINIC_OR_DEPARTMENT_OTHER): Payer: Self-pay

## 2022-11-22 ENCOUNTER — Encounter: Payer: Self-pay | Admitting: Family Medicine

## 2022-11-22 ENCOUNTER — Ambulatory Visit: Payer: BC Managed Care – PPO | Admitting: Family Medicine

## 2022-11-22 VITALS — BP 142/88 | HR 58 | Ht 69.0 in | Wt 191.0 lb

## 2022-11-22 DIAGNOSIS — R1013 Epigastric pain: Secondary | ICD-10-CM

## 2022-11-22 LAB — H. PYLORI ANTIBODY, IGG: H Pylori IgG: NEGATIVE

## 2022-11-22 MED ORDER — PANTOPRAZOLE SODIUM 40 MG PO TBEC: 40.0000 mg | DELAYED_RELEASE_TABLET | Freq: Every day | ORAL | 0 refills | Status: DC

## 2022-11-22 NOTE — Progress Notes (Signed)
Acute Office Visit  Subjective:     Patient ID: Darren Freeman, male    DOB: 1980-07-18, 42 y.o.   MRN: [id number]  Chief Complaint  Patient presents with   GI Problem    HPI Patient is in today for epigastric pain and nausea.   Discussed the use of AI scribe software for clinical note transcription with the patient, who gave verbal consent to proceed.  History of Present Illness   The patient presents with upper abdominal/epigastric discomfort and nausea that has been occurring for a few months. The nausea is described as intense, lasting for about 30 seconds to a minute, and typically occurs in the mornings. Recently, the patient has been experiencing early satiety, feeling full after only one or two bites of breakfast. The nausea has escalated to the point of vomiting once. The patient also reports occasional heartburn.  The patient has made dietary changes in the past two months, notably increasing red meat intake due to a family history of anemia and low iron levels. The patient maintains a generally healthy diet, including fruits and vegetables, and consumes one cup of coffee in the morning.  The patient describes a dull, squeezing sensation in the upper abdomen/epigastric. There has been no noticeable blood in the stool. Over-the-counter medications such as Pepto-Bismol and Gas-X have been used intermittently, with some relief noted. The patient also reports a distended feeling after eating, suggesting slow digestion.  The patient has a history of irregular bowel movements and believes they may have irritable bowel syndrome (IBS). Recently, the patient experienced an episode of diarrhea.  The patient's symptoms have been progressively worsening, over the past week with the discomfort and nausea now lasting longer throughout the day. Despite this, the patient reports feeling well in the late evenings. The patient denies any pain or discomfort in other areas of the abdomen.              ROS All review of systems negative except what is listed in the HPI      Objective:    BP (!) 142/88   Pulse (!) 58   Ht 5\' 9"  (1.753 m)   Wt 191 lb (86.6 kg)   SpO2 100%   BMI 28.21 kg/m    Physical Exam Vitals reviewed.  Constitutional:      Appearance: Normal appearance.  Cardiovascular:     Rate and Rhythm: Normal rate and regular rhythm.     Heart sounds: Normal heart sounds.  Pulmonary:     Effort: Pulmonary effort is normal.     Breath sounds: Normal breath sounds.  Abdominal:     General: Abdomen is flat. Bowel sounds are normal. There is no distension.     Palpations: Abdomen is soft.     Tenderness: There is no guarding. Negative signs include Murphy's sign and McBurney's sign.     Comments: Mild epigastric discomfort on palpation  Skin:    General: Skin is warm and dry.  Neurological:     Mental Status: He is alert and oriented to person, place, and time.  Psychiatric:        Mood and Affect: Mood normal.        Behavior: Behavior normal.        Thought Content: Thought content normal.        Judgment: Judgment normal.         No results found for any visits on 11/22/22.      Assessment & Plan:  Problem List Items Addressed This Visit   None Visit Diagnoses     Epigastric pain    -  Primary New onset of upper abdominal discomfort and nausea, particularly in the mornings. Symptoms include early satiety, distention, and occasional vomiting. No blood in stool. Some relief with Gas-X and Pepto-Bismol.  -Order H. pylori test. -Prescribe Protonix -Advise avoidance of spicy, citric, and caffeinated foods. 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 -Consider GI consult for endoscopy if symptoms persist or worsen.    Relevant Medications   pantoprazole (PROTONIX) 40 MG tablet   Other Relevant Orders   H. pylori antibody, IgG       Meds ordered this encounter  Medications    pantoprazole (PROTONIX) 40 MG tablet    Sig: Take 1 tablet (40 mg total) by mouth daily.    Dispense:  90 tablet    Refill:  0    Order Specific Question:   Supervising Provider    Answer:   Danise Edge A [[id number]]    Return if symptoms worsen or fail to improve.  Clayborne Dana, NP

## 2022-11-24 ENCOUNTER — Encounter: Payer: Self-pay | Admitting: Neurology

## 2022-11-28 ENCOUNTER — Other Ambulatory Visit (HOSPITAL_BASED_OUTPATIENT_CLINIC_OR_DEPARTMENT_OTHER): Payer: Self-pay

## 2022-11-28 MED ORDER — AMPHETAMINE-DEXTROAMPHETAMINE 20 MG PO TABS
20.0000 mg | ORAL_TABLET | Freq: Three times a day (TID) | ORAL | 0 refills | Status: AC
  Filled 2023-01-01: qty 90, 30d supply, fill #0

## 2022-11-28 MED ORDER — AMPHETAMINE-DEXTROAMPHETAMINE 20 MG PO TABS
20.0000 mg | ORAL_TABLET | Freq: Three times a day (TID) | ORAL | 0 refills | Status: DC
  Filled 2023-01-31: qty 90, 30d supply, fill #0

## 2022-11-28 MED ORDER — AMPHETAMINE-DEXTROAMPHETAMINE 20 MG PO TABS
20.0000 mg | ORAL_TABLET | Freq: Three times a day (TID) | ORAL | 0 refills | Status: AC
  Filled 2022-11-30: qty 90, 30d supply, fill #0

## 2022-11-29 ENCOUNTER — Other Ambulatory Visit: Payer: Self-pay | Admitting: Family Medicine

## 2022-11-30 ENCOUNTER — Other Ambulatory Visit (HOSPITAL_BASED_OUTPATIENT_CLINIC_OR_DEPARTMENT_OTHER): Payer: Self-pay

## 2022-12-04 ENCOUNTER — Other Ambulatory Visit: Payer: Self-pay | Admitting: Family Medicine

## 2022-12-08 ENCOUNTER — Other Ambulatory Visit (HOSPITAL_BASED_OUTPATIENT_CLINIC_OR_DEPARTMENT_OTHER): Payer: Self-pay

## 2022-12-08 MED ORDER — ALPRAZOLAM 1 MG PO TABS
1.0000 mg | ORAL_TABLET | Freq: Three times a day (TID) | ORAL | 0 refills | Status: AC | PRN
  Filled 2022-12-09 (×2): qty 90, 30d supply, fill #0

## 2022-12-09 ENCOUNTER — Other Ambulatory Visit (HOSPITAL_BASED_OUTPATIENT_CLINIC_OR_DEPARTMENT_OTHER): Payer: Self-pay

## 2022-12-30 ENCOUNTER — Other Ambulatory Visit (HOSPITAL_BASED_OUTPATIENT_CLINIC_OR_DEPARTMENT_OTHER): Payer: Self-pay

## 2023-01-01 ENCOUNTER — Other Ambulatory Visit: Payer: Self-pay

## 2023-01-01 ENCOUNTER — Other Ambulatory Visit (HOSPITAL_BASED_OUTPATIENT_CLINIC_OR_DEPARTMENT_OTHER): Payer: Self-pay

## 2023-01-09 ENCOUNTER — Other Ambulatory Visit (HOSPITAL_BASED_OUTPATIENT_CLINIC_OR_DEPARTMENT_OTHER): Payer: Self-pay

## 2023-01-09 MED ORDER — ALPRAZOLAM 1 MG PO TABS
1.0000 mg | ORAL_TABLET | Freq: Three times a day (TID) | ORAL | 1 refills | Status: AC
  Filled 2023-01-10 (×2): qty 90, 30d supply, fill #0
  Filled 2023-02-08: qty 90, 30d supply, fill #1

## 2023-01-10 ENCOUNTER — Other Ambulatory Visit (HOSPITAL_BASED_OUTPATIENT_CLINIC_OR_DEPARTMENT_OTHER): Payer: Self-pay

## 2023-01-23 ENCOUNTER — Other Ambulatory Visit (HOSPITAL_BASED_OUTPATIENT_CLINIC_OR_DEPARTMENT_OTHER): Payer: Self-pay

## 2023-01-24 ENCOUNTER — Other Ambulatory Visit (HOSPITAL_BASED_OUTPATIENT_CLINIC_OR_DEPARTMENT_OTHER): Payer: Self-pay

## 2023-01-24 MED ORDER — GABAPENTIN 300 MG PO CAPS
300.0000 mg | ORAL_CAPSULE | Freq: Every day | ORAL | 0 refills | Status: AC
  Filled 2023-01-24: qty 90, 30d supply, fill #0
  Filled 2023-02-16: qty 90, 30d supply, fill #1
  Filled 2023-03-07 – 2023-03-19 (×2): qty 90, 30d supply, fill #2
  Filled 2023-06-02: qty 90, 30d supply, fill #3

## 2023-01-30 ENCOUNTER — Other Ambulatory Visit (HOSPITAL_BASED_OUTPATIENT_CLINIC_OR_DEPARTMENT_OTHER): Payer: Self-pay

## 2023-01-31 ENCOUNTER — Other Ambulatory Visit (HOSPITAL_BASED_OUTPATIENT_CLINIC_OR_DEPARTMENT_OTHER): Payer: Self-pay

## 2023-02-08 ENCOUNTER — Other Ambulatory Visit (HOSPITAL_BASED_OUTPATIENT_CLINIC_OR_DEPARTMENT_OTHER): Payer: Self-pay

## 2023-02-08 ENCOUNTER — Other Ambulatory Visit: Payer: Self-pay

## 2023-02-12 ENCOUNTER — Telehealth: Payer: Self-pay | Admitting: Family Medicine

## 2023-02-12 NOTE — Telephone Encounter (Signed)
No note in the system, that is the only way we write them.   Called patient to see what dates he was out of work. No answer. Will try again later.

## 2023-02-12 NOTE — Telephone Encounter (Signed)
Spoke with patient and he used his visit summary and does not need a note.

## 2023-02-12 NOTE — Telephone Encounter (Signed)
Pt saw Hyman Hopes a couple months ago and lost his work note from that appt. His job is requesting this to be reprinted. He stated we can send to mychart.

## 2023-02-28 ENCOUNTER — Other Ambulatory Visit (HOSPITAL_BASED_OUTPATIENT_CLINIC_OR_DEPARTMENT_OTHER): Payer: Self-pay

## 2023-02-28 MED ORDER — AMPHETAMINE-DEXTROAMPHETAMINE 20 MG PO TABS
20.0000 mg | ORAL_TABLET | Freq: Three times a day (TID) | ORAL | 0 refills | Status: DC
  Filled 2023-03-02: qty 90, 30d supply, fill #0

## 2023-03-02 ENCOUNTER — Other Ambulatory Visit: Payer: Self-pay

## 2023-03-02 ENCOUNTER — Other Ambulatory Visit (HOSPITAL_BASED_OUTPATIENT_CLINIC_OR_DEPARTMENT_OTHER): Payer: Self-pay

## 2023-03-07 ENCOUNTER — Other Ambulatory Visit (HOSPITAL_BASED_OUTPATIENT_CLINIC_OR_DEPARTMENT_OTHER): Payer: Self-pay

## 2023-03-07 MED ORDER — ALPRAZOLAM 1 MG PO TABS
1.0000 mg | ORAL_TABLET | Freq: Three times a day (TID) | ORAL | 0 refills | Status: DC | PRN
  Filled 2023-03-12: qty 90, 30d supply, fill #0

## 2023-03-12 ENCOUNTER — Other Ambulatory Visit (HOSPITAL_BASED_OUTPATIENT_CLINIC_OR_DEPARTMENT_OTHER): Payer: Self-pay

## 2023-03-20 ENCOUNTER — Other Ambulatory Visit: Payer: Self-pay | Admitting: Family Medicine

## 2023-03-20 DIAGNOSIS — R1013 Epigastric pain: Secondary | ICD-10-CM

## 2023-03-20 MED ORDER — PANTOPRAZOLE SODIUM 40 MG PO TBEC: 40.0000 mg | DELAYED_RELEASE_TABLET | Freq: Every day | ORAL | 0 refills | Status: DC

## 2023-04-02 ENCOUNTER — Other Ambulatory Visit (HOSPITAL_BASED_OUTPATIENT_CLINIC_OR_DEPARTMENT_OTHER): Payer: Self-pay

## 2023-04-02 MED ORDER — AMPHETAMINE-DEXTROAMPHETAMINE 20 MG PO TABS
20.0000 mg | ORAL_TABLET | Freq: Three times a day (TID) | ORAL | 0 refills | Status: AC
  Filled 2023-04-02: qty 45, 15d supply, fill #0

## 2023-04-09 ENCOUNTER — Other Ambulatory Visit (HOSPITAL_BASED_OUTPATIENT_CLINIC_OR_DEPARTMENT_OTHER): Payer: Self-pay

## 2023-04-09 MED ORDER — ALPRAZOLAM 1 MG PO TABS
1.0000 mg | ORAL_TABLET | Freq: Three times a day (TID) | ORAL | 0 refills | Status: DC | PRN
  Filled 2023-04-10: qty 90, 30d supply, fill #0

## 2023-04-10 ENCOUNTER — Other Ambulatory Visit (HOSPITAL_BASED_OUTPATIENT_CLINIC_OR_DEPARTMENT_OTHER): Payer: Self-pay

## 2023-04-12 ENCOUNTER — Other Ambulatory Visit (HOSPITAL_BASED_OUTPATIENT_CLINIC_OR_DEPARTMENT_OTHER): Payer: Self-pay

## 2023-04-12 DIAGNOSIS — G2581 Restless legs syndrome: Secondary | ICD-10-CM | POA: Diagnosis not present

## 2023-04-12 DIAGNOSIS — F900 Attention-deficit hyperactivity disorder, predominantly inattentive type: Secondary | ICD-10-CM | POA: Diagnosis not present

## 2023-04-12 DIAGNOSIS — F410 Panic disorder [episodic paroxysmal anxiety] without agoraphobia: Secondary | ICD-10-CM | POA: Diagnosis not present

## 2023-04-13 ENCOUNTER — Other Ambulatory Visit (HOSPITAL_BASED_OUTPATIENT_CLINIC_OR_DEPARTMENT_OTHER): Payer: Self-pay

## 2023-04-13 MED ORDER — AMPHETAMINE-DEXTROAMPHETAMINE 20 MG PO TABS
20.0000 mg | ORAL_TABLET | Freq: Three times a day (TID) | ORAL | 0 refills | Status: AC
  Filled 2023-06-16: qty 90, 30d supply, fill #0

## 2023-04-13 MED ORDER — AMPHETAMINE-DEXTROAMPHETAMINE 20 MG PO TABS
20.0000 mg | ORAL_TABLET | Freq: Three times a day (TID) | ORAL | 0 refills | Status: AC
  Filled 2023-04-14 (×2): qty 90, 30d supply, fill #0

## 2023-04-13 MED ORDER — AMPHETAMINE-DEXTROAMPHETAMINE 20 MG PO TABS
20.0000 mg | ORAL_TABLET | Freq: Three times a day (TID) | ORAL | 0 refills | Status: AC
  Filled 2023-05-17: qty 90, 30d supply, fill #0

## 2023-04-13 MED ORDER — GABAPENTIN 600 MG PO TABS
600.0000 mg | ORAL_TABLET | Freq: Every day | ORAL | 5 refills | Status: DC
  Filled 2023-04-13: qty 60, 30d supply, fill #0
  Filled 2023-05-17 (×2): qty 60, 30d supply, fill #1
  Filled 2023-06-02 – 2023-06-14 (×2): qty 60, 30d supply, fill #2
  Filled 2023-07-11: qty 60, 30d supply, fill #3
  Filled 2023-08-15 (×2): qty 60, 30d supply, fill #4
  Filled 2023-09-13: qty 60, 30d supply, fill #5

## 2023-04-14 ENCOUNTER — Other Ambulatory Visit (HOSPITAL_BASED_OUTPATIENT_CLINIC_OR_DEPARTMENT_OTHER): Payer: Self-pay

## 2023-05-11 ENCOUNTER — Other Ambulatory Visit (HOSPITAL_BASED_OUTPATIENT_CLINIC_OR_DEPARTMENT_OTHER): Payer: Self-pay

## 2023-05-11 MED ORDER — ALPRAZOLAM 1 MG PO TABS
1.0000 mg | ORAL_TABLET | Freq: Three times a day (TID) | ORAL | 2 refills | Status: DC | PRN
  Filled 2023-05-11: qty 90, 30d supply, fill #0
  Filled 2023-06-07: qty 90, 30d supply, fill #1
  Filled 2023-07-05: qty 90, 30d supply, fill #2

## 2023-05-17 ENCOUNTER — Other Ambulatory Visit (HOSPITAL_BASED_OUTPATIENT_CLINIC_OR_DEPARTMENT_OTHER): Payer: Self-pay

## 2023-05-17 ENCOUNTER — Other Ambulatory Visit: Payer: Self-pay

## 2023-05-19 ENCOUNTER — Other Ambulatory Visit (HOSPITAL_BASED_OUTPATIENT_CLINIC_OR_DEPARTMENT_OTHER): Payer: Self-pay

## 2023-06-02 ENCOUNTER — Other Ambulatory Visit (HOSPITAL_BASED_OUTPATIENT_CLINIC_OR_DEPARTMENT_OTHER): Payer: Self-pay

## 2023-06-04 ENCOUNTER — Other Ambulatory Visit (HOSPITAL_BASED_OUTPATIENT_CLINIC_OR_DEPARTMENT_OTHER): Payer: Self-pay

## 2023-06-04 ENCOUNTER — Other Ambulatory Visit: Payer: Self-pay

## 2023-06-08 ENCOUNTER — Other Ambulatory Visit (HOSPITAL_BASED_OUTPATIENT_CLINIC_OR_DEPARTMENT_OTHER): Payer: Self-pay

## 2023-06-16 ENCOUNTER — Other Ambulatory Visit: Payer: Self-pay | Admitting: Family Medicine

## 2023-06-16 ENCOUNTER — Other Ambulatory Visit (HOSPITAL_BASED_OUTPATIENT_CLINIC_OR_DEPARTMENT_OTHER): Payer: Self-pay

## 2023-06-16 DIAGNOSIS — R1013 Epigastric pain: Secondary | ICD-10-CM

## 2023-07-05 ENCOUNTER — Other Ambulatory Visit: Payer: Self-pay

## 2023-07-11 ENCOUNTER — Other Ambulatory Visit (HOSPITAL_BASED_OUTPATIENT_CLINIC_OR_DEPARTMENT_OTHER): Payer: Self-pay

## 2023-07-11 MED ORDER — AMPHETAMINE-DEXTROAMPHETAMINE 20 MG PO TABS
ORAL_TABLET | ORAL | 0 refills | Status: AC
Start: 2023-07-16 — End: ?
  Filled 2023-07-16: qty 90, 30d supply, fill #0

## 2023-07-12 ENCOUNTER — Other Ambulatory Visit (HOSPITAL_BASED_OUTPATIENT_CLINIC_OR_DEPARTMENT_OTHER): Payer: Self-pay

## 2023-07-16 ENCOUNTER — Other Ambulatory Visit: Payer: Self-pay

## 2023-07-16 ENCOUNTER — Other Ambulatory Visit (HOSPITAL_BASED_OUTPATIENT_CLINIC_OR_DEPARTMENT_OTHER): Payer: Self-pay

## 2023-07-23 ENCOUNTER — Other Ambulatory Visit (HOSPITAL_BASED_OUTPATIENT_CLINIC_OR_DEPARTMENT_OTHER): Payer: Self-pay

## 2023-07-24 ENCOUNTER — Other Ambulatory Visit (HOSPITAL_BASED_OUTPATIENT_CLINIC_OR_DEPARTMENT_OTHER): Payer: Self-pay

## 2023-08-03 ENCOUNTER — Other Ambulatory Visit (HOSPITAL_BASED_OUTPATIENT_CLINIC_OR_DEPARTMENT_OTHER): Payer: Self-pay

## 2023-08-03 MED ORDER — ALPRAZOLAM 1 MG PO TABS
1.0000 mg | ORAL_TABLET | Freq: Three times a day (TID) | ORAL | 1 refills | Status: AC | PRN
  Filled 2023-08-09 (×2): qty 90, 30d supply, fill #0
  Filled 2023-09-04 – 2023-09-06 (×2): qty 90, 30d supply, fill #1

## 2023-08-07 ENCOUNTER — Other Ambulatory Visit (HOSPITAL_BASED_OUTPATIENT_CLINIC_OR_DEPARTMENT_OTHER): Payer: Self-pay

## 2023-08-09 ENCOUNTER — Other Ambulatory Visit (HOSPITAL_BASED_OUTPATIENT_CLINIC_OR_DEPARTMENT_OTHER): Payer: Self-pay

## 2023-08-13 ENCOUNTER — Other Ambulatory Visit (HOSPITAL_BASED_OUTPATIENT_CLINIC_OR_DEPARTMENT_OTHER): Payer: Self-pay

## 2023-08-13 MED ORDER — AMPHETAMINE-DEXTROAMPHETAMINE 20 MG PO TABS
20.0000 mg | ORAL_TABLET | Freq: Three times a day (TID) | ORAL | 0 refills | Status: DC
  Filled 2023-08-13 – 2023-08-15 (×2): qty 90, 30d supply, fill #0

## 2023-08-15 ENCOUNTER — Other Ambulatory Visit: Payer: Self-pay

## 2023-08-15 ENCOUNTER — Other Ambulatory Visit (HOSPITAL_BASED_OUTPATIENT_CLINIC_OR_DEPARTMENT_OTHER): Payer: Self-pay

## 2023-09-04 ENCOUNTER — Other Ambulatory Visit (HOSPITAL_BASED_OUTPATIENT_CLINIC_OR_DEPARTMENT_OTHER): Payer: Self-pay

## 2023-09-05 ENCOUNTER — Other Ambulatory Visit: Payer: Self-pay

## 2023-09-05 ENCOUNTER — Other Ambulatory Visit (HOSPITAL_BASED_OUTPATIENT_CLINIC_OR_DEPARTMENT_OTHER): Payer: Self-pay

## 2023-09-13 ENCOUNTER — Other Ambulatory Visit (HOSPITAL_BASED_OUTPATIENT_CLINIC_OR_DEPARTMENT_OTHER): Payer: Self-pay

## 2023-09-13 MED ORDER — AMPHETAMINE-DEXTROAMPHETAMINE 20 MG PO TABS
20.0000 mg | ORAL_TABLET | Freq: Three times a day (TID) | ORAL | 0 refills | Status: DC
  Filled 2023-09-14: qty 90, 30d supply, fill #0

## 2023-09-14 ENCOUNTER — Other Ambulatory Visit (HOSPITAL_COMMUNITY): Payer: Self-pay

## 2023-09-14 ENCOUNTER — Other Ambulatory Visit: Payer: Self-pay

## 2023-09-14 ENCOUNTER — Other Ambulatory Visit (HOSPITAL_BASED_OUTPATIENT_CLINIC_OR_DEPARTMENT_OTHER): Payer: Self-pay

## 2023-10-04 DIAGNOSIS — F900 Attention-deficit hyperactivity disorder, predominantly inattentive type: Secondary | ICD-10-CM | POA: Diagnosis not present

## 2023-10-04 DIAGNOSIS — F410 Panic disorder [episodic paroxysmal anxiety] without agoraphobia: Secondary | ICD-10-CM | POA: Diagnosis not present

## 2023-10-04 DIAGNOSIS — G2581 Restless legs syndrome: Secondary | ICD-10-CM | POA: Diagnosis not present

## 2023-10-07 ENCOUNTER — Other Ambulatory Visit (HOSPITAL_BASED_OUTPATIENT_CLINIC_OR_DEPARTMENT_OTHER): Payer: Self-pay

## 2023-10-08 ENCOUNTER — Other Ambulatory Visit (HOSPITAL_BASED_OUTPATIENT_CLINIC_OR_DEPARTMENT_OTHER): Payer: Self-pay

## 2023-10-08 MED ORDER — ALPRAZOLAM 1 MG PO TABS
1.0000 mg | ORAL_TABLET | Freq: Three times a day (TID) | ORAL | 0 refills | Status: DC
  Filled 2023-10-08: qty 90, 30d supply, fill #0

## 2023-10-11 ENCOUNTER — Other Ambulatory Visit (HOSPITAL_BASED_OUTPATIENT_CLINIC_OR_DEPARTMENT_OTHER): Payer: Self-pay

## 2023-10-11 MED ORDER — AMPHETAMINE-DEXTROAMPHETAMINE 20 MG PO TABS
20.0000 mg | ORAL_TABLET | Freq: Three times a day (TID) | ORAL | 0 refills | Status: DC
  Filled 2023-10-13: qty 90, 30d supply, fill #0

## 2023-10-13 ENCOUNTER — Other Ambulatory Visit (HOSPITAL_BASED_OUTPATIENT_CLINIC_OR_DEPARTMENT_OTHER): Payer: Self-pay

## 2023-10-15 ENCOUNTER — Other Ambulatory Visit (HOSPITAL_BASED_OUTPATIENT_CLINIC_OR_DEPARTMENT_OTHER): Payer: Self-pay

## 2023-10-15 MED ORDER — GABAPENTIN 600 MG PO TABS
600.0000 mg | ORAL_TABLET | Freq: Every day | ORAL | 5 refills | Status: AC
  Filled 2023-10-15: qty 60, 30d supply, fill #0
  Filled 2023-11-11: qty 60, 30d supply, fill #1
  Filled 2023-12-13: qty 60, 30d supply, fill #2

## 2023-11-05 ENCOUNTER — Other Ambulatory Visit (HOSPITAL_BASED_OUTPATIENT_CLINIC_OR_DEPARTMENT_OTHER): Payer: Self-pay

## 2023-11-05 MED ORDER — ALPRAZOLAM 1 MG PO TABS
1.0000 mg | ORAL_TABLET | Freq: Three times a day (TID) | ORAL | 2 refills | Status: DC
  Filled 2023-11-07: qty 90, 30d supply, fill #0
  Filled 2023-12-06: qty 90, 30d supply, fill #1
  Filled 2024-01-05 (×2): qty 90, 30d supply, fill #2

## 2023-11-07 ENCOUNTER — Other Ambulatory Visit: Payer: Self-pay

## 2023-11-07 ENCOUNTER — Other Ambulatory Visit (HOSPITAL_BASED_OUTPATIENT_CLINIC_OR_DEPARTMENT_OTHER): Payer: Self-pay

## 2023-11-07 DIAGNOSIS — M545 Low back pain, unspecified: Secondary | ICD-10-CM | POA: Diagnosis not present

## 2023-11-07 DIAGNOSIS — R030 Elevated blood-pressure reading, without diagnosis of hypertension: Secondary | ICD-10-CM | POA: Diagnosis not present

## 2023-11-07 DIAGNOSIS — Z6828 Body mass index (BMI) 28.0-28.9, adult: Secondary | ICD-10-CM | POA: Diagnosis not present

## 2023-11-07 DIAGNOSIS — F5101 Primary insomnia: Secondary | ICD-10-CM | POA: Diagnosis not present

## 2023-11-07 DIAGNOSIS — R0602 Shortness of breath: Secondary | ICD-10-CM | POA: Diagnosis not present

## 2023-11-07 DIAGNOSIS — Z79899 Other long term (current) drug therapy: Secondary | ICD-10-CM | POA: Diagnosis not present

## 2023-11-07 DIAGNOSIS — Z0000 Encounter for general adult medical examination without abnormal findings: Secondary | ICD-10-CM | POA: Diagnosis not present

## 2023-11-11 ENCOUNTER — Other Ambulatory Visit (HOSPITAL_BASED_OUTPATIENT_CLINIC_OR_DEPARTMENT_OTHER): Payer: Self-pay

## 2023-11-12 ENCOUNTER — Other Ambulatory Visit: Payer: Self-pay

## 2023-11-12 ENCOUNTER — Other Ambulatory Visit (HOSPITAL_BASED_OUTPATIENT_CLINIC_OR_DEPARTMENT_OTHER): Payer: Self-pay

## 2023-11-12 DIAGNOSIS — Z1159 Encounter for screening for other viral diseases: Secondary | ICD-10-CM | POA: Diagnosis not present

## 2023-11-12 DIAGNOSIS — M129 Arthropathy, unspecified: Secondary | ICD-10-CM | POA: Diagnosis not present

## 2023-11-12 DIAGNOSIS — Z79899 Other long term (current) drug therapy: Secondary | ICD-10-CM | POA: Diagnosis not present

## 2023-11-12 DIAGNOSIS — Z114 Encounter for screening for human immunodeficiency virus [HIV]: Secondary | ICD-10-CM | POA: Diagnosis not present

## 2023-11-12 DIAGNOSIS — F5101 Primary insomnia: Secondary | ICD-10-CM | POA: Diagnosis not present

## 2023-11-12 DIAGNOSIS — R030 Elevated blood-pressure reading, without diagnosis of hypertension: Secondary | ICD-10-CM | POA: Diagnosis not present

## 2023-11-12 DIAGNOSIS — Z125 Encounter for screening for malignant neoplasm of prostate: Secondary | ICD-10-CM | POA: Diagnosis not present

## 2023-11-12 DIAGNOSIS — Z6828 Body mass index (BMI) 28.0-28.9, adult: Secondary | ICD-10-CM | POA: Diagnosis not present

## 2023-11-12 DIAGNOSIS — M545 Low back pain, unspecified: Secondary | ICD-10-CM | POA: Diagnosis not present

## 2023-11-12 MED ORDER — AMPHETAMINE-DEXTROAMPHETAMINE 20 MG PO TABS
20.0000 mg | ORAL_TABLET | Freq: Three times a day (TID) | ORAL | 0 refills | Status: DC
  Filled 2023-11-13: qty 90, 30d supply, fill #0

## 2023-11-13 ENCOUNTER — Other Ambulatory Visit (HOSPITAL_BASED_OUTPATIENT_CLINIC_OR_DEPARTMENT_OTHER): Payer: Self-pay

## 2023-11-15 DIAGNOSIS — Z79899 Other long term (current) drug therapy: Secondary | ICD-10-CM | POA: Diagnosis not present

## 2023-11-23 DIAGNOSIS — Z79899 Other long term (current) drug therapy: Secondary | ICD-10-CM | POA: Diagnosis not present

## 2023-11-23 DIAGNOSIS — Z6827 Body mass index (BMI) 27.0-27.9, adult: Secondary | ICD-10-CM | POA: Diagnosis not present

## 2023-11-23 DIAGNOSIS — R030 Elevated blood-pressure reading, without diagnosis of hypertension: Secondary | ICD-10-CM | POA: Diagnosis not present

## 2023-11-23 DIAGNOSIS — M545 Low back pain, unspecified: Secondary | ICD-10-CM | POA: Diagnosis not present

## 2023-11-28 DIAGNOSIS — Z79899 Other long term (current) drug therapy: Secondary | ICD-10-CM | POA: Diagnosis not present

## 2023-12-07 ENCOUNTER — Other Ambulatory Visit: Payer: Self-pay

## 2023-12-10 ENCOUNTER — Other Ambulatory Visit (HOSPITAL_BASED_OUTPATIENT_CLINIC_OR_DEPARTMENT_OTHER): Payer: Self-pay

## 2023-12-11 ENCOUNTER — Other Ambulatory Visit (HOSPITAL_BASED_OUTPATIENT_CLINIC_OR_DEPARTMENT_OTHER): Payer: Self-pay

## 2023-12-11 MED ORDER — AMPHETAMINE-DEXTROAMPHETAMINE 20 MG PO TABS
20.0000 mg | ORAL_TABLET | Freq: Three times a day (TID) | ORAL | 0 refills | Status: DC
  Filled 2023-12-13: qty 90, 30d supply, fill #0

## 2023-12-13 ENCOUNTER — Other Ambulatory Visit (HOSPITAL_BASED_OUTPATIENT_CLINIC_OR_DEPARTMENT_OTHER): Payer: Self-pay

## 2024-01-05 ENCOUNTER — Other Ambulatory Visit (HOSPITAL_BASED_OUTPATIENT_CLINIC_OR_DEPARTMENT_OTHER): Payer: Self-pay

## 2024-01-05 ENCOUNTER — Other Ambulatory Visit (HOSPITAL_COMMUNITY): Payer: Self-pay

## 2024-01-08 ENCOUNTER — Other Ambulatory Visit (HOSPITAL_BASED_OUTPATIENT_CLINIC_OR_DEPARTMENT_OTHER): Payer: Self-pay

## 2024-01-08 MED ORDER — AMPHETAMINE-DEXTROAMPHETAMINE 20 MG PO TABS
20.0000 mg | ORAL_TABLET | Freq: Three times a day (TID) | ORAL | 0 refills | Status: DC
  Filled 2024-01-12: qty 90, 30d supply, fill #0

## 2024-01-12 ENCOUNTER — Other Ambulatory Visit (HOSPITAL_BASED_OUTPATIENT_CLINIC_OR_DEPARTMENT_OTHER): Payer: Self-pay

## 2024-02-05 ENCOUNTER — Other Ambulatory Visit (HOSPITAL_BASED_OUTPATIENT_CLINIC_OR_DEPARTMENT_OTHER): Payer: Self-pay

## 2024-02-05 MED ORDER — ALPRAZOLAM 1 MG PO TABS
1.0000 mg | ORAL_TABLET | Freq: Three times a day (TID) | ORAL | 1 refills | Status: DC
  Filled 2024-02-05: qty 90, 30d supply, fill #0
  Filled 2024-03-05: qty 90, 30d supply, fill #1

## 2024-02-07 ENCOUNTER — Other Ambulatory Visit (HOSPITAL_BASED_OUTPATIENT_CLINIC_OR_DEPARTMENT_OTHER): Payer: Self-pay

## 2024-02-08 ENCOUNTER — Other Ambulatory Visit (HOSPITAL_BASED_OUTPATIENT_CLINIC_OR_DEPARTMENT_OTHER): Payer: Self-pay

## 2024-02-08 MED ORDER — AMPHETAMINE-DEXTROAMPHETAMINE 20 MG PO TABS
20.0000 mg | ORAL_TABLET | Freq: Three times a day (TID) | ORAL | 0 refills | Status: DC
  Filled 2024-02-11: qty 90, 30d supply, fill #0

## 2024-02-11 ENCOUNTER — Other Ambulatory Visit (HOSPITAL_BASED_OUTPATIENT_CLINIC_OR_DEPARTMENT_OTHER): Payer: Self-pay

## 2024-02-12 ENCOUNTER — Other Ambulatory Visit (HOSPITAL_BASED_OUTPATIENT_CLINIC_OR_DEPARTMENT_OTHER): Payer: Self-pay

## 2024-03-05 ENCOUNTER — Other Ambulatory Visit: Payer: Self-pay

## 2024-03-07 ENCOUNTER — Other Ambulatory Visit (HOSPITAL_BASED_OUTPATIENT_CLINIC_OR_DEPARTMENT_OTHER): Payer: Self-pay

## 2024-03-07 MED ORDER — AMPHETAMINE-DEXTROAMPHETAMINE 20 MG PO TABS
20.0000 mg | ORAL_TABLET | Freq: Three times a day (TID) | ORAL | 0 refills | Status: DC
  Filled 2024-03-12 (×2): qty 90, 30d supply, fill #0

## 2024-03-12 ENCOUNTER — Other Ambulatory Visit (HOSPITAL_BASED_OUTPATIENT_CLINIC_OR_DEPARTMENT_OTHER): Payer: Self-pay

## 2024-03-12 ENCOUNTER — Other Ambulatory Visit (HOSPITAL_COMMUNITY): Payer: Self-pay

## 2024-03-26 DIAGNOSIS — F900 Attention-deficit hyperactivity disorder, predominantly inattentive type: Secondary | ICD-10-CM | POA: Diagnosis not present

## 2024-03-26 DIAGNOSIS — F410 Panic disorder [episodic paroxysmal anxiety] without agoraphobia: Secondary | ICD-10-CM | POA: Diagnosis not present

## 2024-03-26 DIAGNOSIS — G2581 Restless legs syndrome: Secondary | ICD-10-CM | POA: Diagnosis not present

## 2024-04-04 ENCOUNTER — Other Ambulatory Visit (HOSPITAL_BASED_OUTPATIENT_CLINIC_OR_DEPARTMENT_OTHER): Payer: Self-pay

## 2024-04-05 ENCOUNTER — Other Ambulatory Visit (HOSPITAL_BASED_OUTPATIENT_CLINIC_OR_DEPARTMENT_OTHER): Payer: Self-pay

## 2024-04-05 MED ORDER — ALPRAZOLAM 1 MG PO TABS
1.0000 mg | ORAL_TABLET | Freq: Three times a day (TID) | ORAL | 2 refills | Status: AC
  Filled 2024-04-05: qty 90, 30d supply, fill #0
  Filled 2024-05-05: qty 90, 30d supply, fill #1

## 2024-04-07 ENCOUNTER — Other Ambulatory Visit (HOSPITAL_BASED_OUTPATIENT_CLINIC_OR_DEPARTMENT_OTHER): Payer: Self-pay

## 2024-04-07 MED ORDER — AMPHETAMINE-DEXTROAMPHETAMINE 20 MG PO TABS
20.0000 mg | ORAL_TABLET | Freq: Three times a day (TID) | ORAL | 0 refills | Status: DC
  Filled 2024-04-11: qty 90, 30d supply, fill #0

## 2024-04-11 ENCOUNTER — Other Ambulatory Visit (HOSPITAL_BASED_OUTPATIENT_CLINIC_OR_DEPARTMENT_OTHER): Payer: Self-pay

## 2024-05-05 ENCOUNTER — Other Ambulatory Visit: Payer: Self-pay

## 2024-05-06 ENCOUNTER — Other Ambulatory Visit (HOSPITAL_BASED_OUTPATIENT_CLINIC_OR_DEPARTMENT_OTHER): Payer: Self-pay

## 2024-05-06 ENCOUNTER — Ambulatory Visit: Admitting: Physician Assistant

## 2024-05-06 ENCOUNTER — Ambulatory Visit: Payer: Self-pay

## 2024-05-06 ENCOUNTER — Encounter: Payer: Self-pay | Admitting: Physician Assistant

## 2024-05-06 ENCOUNTER — Ambulatory Visit

## 2024-05-06 VITALS — BP 136/88 | HR 57 | Temp 97.3°F | Ht 69.0 in | Wt 205.2 lb

## 2024-05-06 DIAGNOSIS — R0602 Shortness of breath: Secondary | ICD-10-CM

## 2024-05-06 DIAGNOSIS — R7401 Elevation of levels of liver transaminase levels: Secondary | ICD-10-CM

## 2024-05-06 DIAGNOSIS — Z79890 Hormone replacement therapy (postmenopausal): Secondary | ICD-10-CM

## 2024-05-06 NOTE — Telephone Encounter (Signed)
 Patient scheduled to see Sheppard Buttner 05/06/2024. Aware to see help iin ED if gets worse before can be seen. Dr. Katrinka will review triage notes.

## 2024-05-06 NOTE — Progress Notes (Signed)
 "  History of Present Illness:   Chief Complaint  Patient presents with   Edema    Pt c/o abdominal distention, bloating, some ankle and feet edema, low back pain, facial swelling started a week ago.    Discussed the use of AI scribe software for clinical note transcription with the patient, who gave verbal consent to proceed.  History of Present Illness   Darren Freeman is a 44 year old male who presents with sudden weight gain and abdominal distension.  Over the past two weeks he has gained 3 lbs (189 to 192 lbs), which is unusual for him, with new abdominal distension and noticeable fluid retention in his face and ankles, requiring looser pants. He weighed himself in the office today and is now up to 205 pounds. 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   He has been on the same dose and frequency of testosterone  therapy for nearly fifteen years -- 200 mg IM twice a week.   He has prescriptions for Xanax  1 mg three times daily as needed, Adderall 20 mg three times daily, and gabapentin  300 - 1200 mg daily for restless legs syndrome but he reports that has not taken them recently. He stopped taking creatine three days ago after noticing bloating.  About a month ago he had a brief episode of chest tightness and difficulty breathing that resolved within a couple of minutes. He denies recent changes in routine, diet, or activity. He reports no significant changes in bowel habits or urination.  Recent blood work in December showed slightly elevated liver enzymes and hematocrit. He reports occasional wheezing, especially with exercise, and tightness in his lower back with activity.  His blood pressure readings have been variable and he does not monitor them regularly. He denies recent fever or flu-like symptoms. He has shortness of breath with exertion and occasional dizziness  but no significant change in urination or bowel movements and uses Citrucel daily.        Past Medical History:  Diagnosis Date   ADD (attention deficit disorder)    Anxiety    Asthma    ? allergy related. can run and no issues. sometimes wheeze or SOB after lifting or sex   Depression    Elevated LFTs    GERD (gastroesophageal reflux disease)    Glucose intolerance (impaired glucose tolerance)    previously on metformin   HTN (hypertension)    Hypogonadism male    Low testosterone     Vitamin D  deficiency      Social History[1]  Past Surgical History:  Procedure Laterality Date   pectoral surgery     penile surgery as child- thin urethra      Family History  Problem Relation Age of Onset   Breast cancer Paternal Grandmother    Depression Mother        behavioral health admissions 3-4x   Hyperlipidemia Father    Hypertension Father    Depression Sister    Depression Brother    Diabetes Paternal Grandfather    Colon cancer Neg Hx     Allergies[2]  Current Medications:  Current Medications[3]   Review of Systems:   Negative unless otherwise specified per HPI.  Vitals:   Vitals:   05/06/24 1026  BP: 136/88  Pulse: (!) 57  Temp: (!) 97.3 F (36.3 C)  TempSrc: Temporal  SpO2: 98%  Weight: 205 lb 4 oz (93.1 kg)  Height: 5' 9 (1.753 m)     Body mass index is 30.31 kg/m.  Physical Exam:   Physical Exam Vitals and nursing note reviewed.  Constitutional:      Appearance: He is well-developed.  HENT:     Head: Normocephalic.  Eyes:     Conjunctiva/sclera: Conjunctivae normal.     Pupils: Pupils are equal, round, and reactive to light.  Pulmonary:     Effort: Pulmonary effort is normal.  Musculoskeletal:        General: Normal range of motion.     Cervical back: Normal range of motion.  Skin:    General: Skin is warm and dry.  Neurological:     Mental Status: He is alert and oriented to person, place, and time.  Psychiatric:         Behavior: Behavior normal.        Thought Content: Thought content normal.        Judgment: Judgment normal.     Assessment and Plan:   Assessment and Plan    Short of breath on exertion  Acute fluid retention and dyspnea with weight gain. Differential includes cardiac issues, volume overload, or clotting disorder. Testosterone  therapy may increase cardiac risk. - Ordered EKG. EKG tracing is personally reviewed.  EKG notes NSR.  Flattened T waves in V5 and V6 compared to prior EKG 2023. - Due to abnormal EKG, acute weight gain, shortness of breath on exertion, and suspected supratherapeutic use of testosterone  -- recommend that he proceed to ER for evaluation. He is agreeable to this. He is with his girlfriend who is able to provide safe transportation.  Long-term testosterone  therapy Management per specialist; he reports there is no concerns for supratherapeutic dosing as he has been on this dosage for quite some time.    Elevated liver enzymes Chronic elevation of liver enzymes without acute symptoms per patient. - We were going to obtain blood work today however due to change in plans to send patient to ER, we did not repeat these today.  - recommend close follow up with Primary Care Provider (PCP) for ongoing management of this  I personally spent a total of 61 minutes in the care of the patient today including preparing to see the patient, getting/reviewing separately obtained history, performing a medically appropriate exam/evaluation, counseling and educating, placing orders, referring and communicating with other health care professionals, documenting clinical information in the EHR, and independently interpreting results.   Lucie Buttner, PA-C    [1]  Social History Tobacco Use   Smoking status: Former    Current packs/day: 0.00    Types: Cigarettes    Quit date: 07/30/2001    Years since quitting: 22.7   Smokeless tobacco: Never  Vaping Use   Vaping status: Never  Used  Substance Use Topics   Alcohol use: No    Comment: rarely   Drug use: Not Currently    Types: Marijuana    Comment: previously used cocaine (quit x 2 years)  [2] No Known Allergies [3]  Current Outpatient Medications:    ALPRAZolam  (XANAX ) 1 MG tablet, Take 1 tablet (1 mg total) by mouth 3 (three) times daily as needed., Disp: 90 tablet, Rfl: 0   ALPRAZolam  (XANAX ) 1 MG tablet, Take 1 tablet (1 mg total) by mouth 3 (three) times daily as needed., Disp: 90 tablet, Rfl: 1   ALPRAZolam  (XANAX ) 1 MG tablet, Take 1 tablet (1 mg total) by mouth  3 (three) times daily as needed., Disp: 90 tablet, Rfl: 1   ALPRAZolam  (XANAX ) 1 MG tablet, Take 1 tablet (1 mg total) by mouth 3 (three) times daily., Disp: 90 tablet, Rfl: 2   amphetamine -dextroamphetamine  (ADDERALL) 20 MG tablet, Take 1 tablet (20 mg total) by mouth 3 (three) times daily., Disp: 90 tablet, Rfl: 0   amphetamine -dextroamphetamine  (ADDERALL) 20 MG tablet, Take 1 tablet (20 mg total) by mouth 3 (three) times daily., Disp: 90 tablet, Rfl: 0   amphetamine -dextroamphetamine  (ADDERALL) 20 MG tablet, Take 1 tablet (20 mg total) by mouth 3 (three) times daily., Disp: 45 tablet, Rfl: 0   amphetamine -dextroamphetamine  (ADDERALL) 20 MG tablet, Take 1 tablet (20 mg total) by mouth 3 (three) times daily., Disp: 90 tablet, Rfl: 0   amphetamine -dextroamphetamine  (ADDERALL) 20 MG tablet, Take 1 tablet (20 mg total) by mouth 3 (three) times daily., Disp: 90 tablet, Rfl: 0   amphetamine -dextroamphetamine  (ADDERALL) 20 MG tablet, Take 1 tablet (20 mg total) by mouth 3 (three) times daily., Disp: 90 tablet, Rfl: 0   amphetamine -dextroamphetamine  (ADDERALL) 20 MG tablet, Take 1 tablet (20 mg total) by mouth 3 (three) times daily., Disp: 90 tablet, Rfl: 0   amphetamine -dextroamphetamine  (ADDERALL) 20 MG tablet, Take 1 tablet (20 mg total) by mouth 3 (three) times daily., Disp: 90 tablet, Rfl: 0   gabapentin  (NEURONTIN ) 300 MG capsule, Take 1-3 capsules  (300-900 mg total) by mouth at bedtime., Disp: 360 capsule, Rfl: 0   gabapentin  (NEURONTIN ) 600 MG tablet, Take 1-2 tablets (600-1,200 mg total) by mouth at bedtime., Disp: 60 tablet, Rfl: 5   Multiple Vitamin (MULTIVITAMIN) tablet, Take 1 tablet by mouth daily., Disp: , Rfl:    Omega-3 Fatty Acids (FISH OIL OMEGA-3 PO), Take 1 capsule by mouth daily in the afternoon., Disp: , Rfl:    testosterone  cypionate (DEPOTESTOSTERONE CYPIONATE) 200 MG/ML injection, Inject 200 mg into the muscle 2 (two) times a week., Disp: , Rfl:   "

## 2024-05-06 NOTE — Telephone Encounter (Signed)
 FYI Only or Action Required?: FYI only for provider: appointment scheduled on 05/06/24.  Patient was last seen in primary care on 11/22/2022 by Almarie Waddell NOVAK, NP.  Called Nurse Triage reporting Shortness of Breath, Facial Swelling, and Edema.  Symptoms began a week ago.  Interventions attempted: Nothing.  Symptoms are: gradually worsening.  Triage Disposition: See HCP Within 4 Hours (Or PCP Triage)  Patient/caregiver understands and will follow disposition?: Yes                    Message from Mercedes MATSU sent at 05/06/2024  8:58 AM EST  Summary: Red Word: Difficulty breathing   Reason for Triage: Patient called in stating that his stomach feels descended which is causing him to have difficulty breathing. Patient also states that his ankles, and hands are mildly swollen. Patient stated that he is very concerned that he is retaining a lot of fluids as well.         Reason for Disposition  Swelling of both lower legs (i.e., bilateral pedal edema)  Answer Assessment - Initial Assessment Questions 1. ONSET: When did the swelling start? (e.g., minutes, hours, days)     X 1 week  2. LOCATION: What part of the face is swollen? (e.g., cheek, entire face, jaw joint area, under jaw)     Cheeks and chin  3. SEVERITY: How swollen is it?     Mild, looks like a double chin  4. ITCHING: Is there any itching? If Yes, ask: How much?   (Scale 1-10; mild, moderate or severe)     No.  5. PAIN: Is the swelling painful to touch? If Yes, ask: How painful is it?   (Scale 0-10; mild, moderate or severe)     No.  6. FEVER: Do you have a fever? If Yes, ask: What is it, how was it measured, and when did it start?      No.  7. CAUSE: What do you think is causing the face swelling?     Unknown.  8. NEW MEDICINES: Have there been any new medicines started recently?     No.   9. OTHER SYMPTOMS: Do you have any other symptoms? (e.g., leg swelling,  toothache)       Abdominal swelling/distension, bilateral feet swelling, back pain, intermittent nausea, SOB when lying down and with exertion. BM 2-3 times daily, no constipation.  No vomiting, diarrhea, bleeding, black tarry stools, difficulty swallowing, tongue swelling, fever, rash, redness, abdominal pain, facial pain  Protocols used: Face Swelling-A-AH

## 2024-05-06 NOTE — Patient Instructions (Addendum)
 Please go to the ER for further evaluation

## 2024-05-07 ENCOUNTER — Encounter (HOSPITAL_COMMUNITY): Payer: Self-pay

## 2024-05-07 ENCOUNTER — Other Ambulatory Visit (HOSPITAL_BASED_OUTPATIENT_CLINIC_OR_DEPARTMENT_OTHER): Payer: Self-pay

## 2024-05-07 ENCOUNTER — Telehealth: Payer: Self-pay

## 2024-05-07 ENCOUNTER — Emergency Department (HOSPITAL_COMMUNITY)

## 2024-05-07 ENCOUNTER — Other Ambulatory Visit: Payer: Self-pay

## 2024-05-07 ENCOUNTER — Emergency Department (HOSPITAL_COMMUNITY)
Admission: EM | Admit: 2024-05-07 | Discharge: 2024-05-07 | Disposition: A | Discharge status: Home / Self Care | Source: Ambulatory Visit

## 2024-05-07 DIAGNOSIS — R140 Abdominal distension (gaseous): Secondary | ICD-10-CM | POA: Insufficient documentation

## 2024-05-07 DIAGNOSIS — R7989 Other specified abnormal findings of blood chemistry: Secondary | ICD-10-CM | POA: Diagnosis not present

## 2024-05-07 DIAGNOSIS — Z87891 Personal history of nicotine dependence: Secondary | ICD-10-CM | POA: Diagnosis not present

## 2024-05-07 DIAGNOSIS — R7401 Elevation of levels of liver transaminase levels: Secondary | ICD-10-CM | POA: Diagnosis not present

## 2024-05-07 DIAGNOSIS — R109 Unspecified abdominal pain: Secondary | ICD-10-CM | POA: Diagnosis present

## 2024-05-07 DIAGNOSIS — I517 Cardiomegaly: Secondary | ICD-10-CM | POA: Diagnosis not present

## 2024-05-07 LAB — BASIC METABOLIC PANEL WITH GFR
Anion gap: 11 (ref 5–15)
BUN: 27 mg/dL — ABNORMAL HIGH (ref 6–20)
CO2: 23 mmol/L (ref 22–32)
Calcium: 9 mg/dL (ref 8.9–10.3)
Chloride: 104 mmol/L (ref 98–111)
Creatinine, Ser: 1.18 mg/dL (ref 0.61–1.24)
GFR, Estimated: >60 mL/min
Glucose, Bld: 91 mg/dL (ref 70–99)
Potassium: 4.2 mmol/L (ref 3.5–5.1)
Sodium: 137 mmol/L (ref 135–145)

## 2024-05-07 LAB — URINALYSIS, ROUTINE W REFLEX MICROSCOPIC
Bilirubin Urine: NEGATIVE
Glucose, UA: NEGATIVE mg/dL
Hgb urine dipstick: NEGATIVE
Ketones, ur: NEGATIVE mg/dL
Leukocytes,Ua: NEGATIVE
Nitrite: NEGATIVE
Protein, ur: NEGATIVE mg/dL
Specific Gravity, Urine: 1.021 (ref 1.005–1.030)
pH: 5 (ref 5.0–8.0)

## 2024-05-07 LAB — CBC
HCT: 50.4 % (ref 39.0–52.0)
Hemoglobin: 17.2 g/dL — ABNORMAL HIGH (ref 13.0–17.0)
MCH: 31 pg (ref 26.0–34.0)
MCHC: 34.1 g/dL (ref 30.0–36.0)
MCV: 90.8 fL (ref 80.0–100.0)
Platelets: 203 10*3/uL (ref 150–400)
RBC: 5.55 MIL/uL (ref 4.22–5.81)
RDW: 13.6 % (ref 11.5–15.5)
WBC: 7.5 10*3/uL (ref 4.0–10.5)
nRBC: 0 % (ref 0.0–0.2)

## 2024-05-07 LAB — D-DIMER, QUANTITATIVE: D-Dimer, Quant: <0.27 ug{FEU}/mL (ref 0.00–0.50)

## 2024-05-07 LAB — HEPATIC FUNCTION PANEL
ALT: 150 U/L — ABNORMAL HIGH (ref 0–44)
AST: 102 U/L — ABNORMAL HIGH (ref 15–41)
Albumin: 4.2 g/dL (ref 3.5–5.0)
Alkaline Phosphatase: 36 U/L — ABNORMAL LOW (ref 38–126)
Bilirubin, Direct: 0.2 mg/dL (ref 0.0–0.2)
Indirect Bilirubin: 0.3 mg/dL (ref 0.3–0.9)
Total Bilirubin: 0.5 mg/dL (ref 0.0–1.2)
Total Protein: 7 g/dL (ref 6.5–8.1)

## 2024-05-07 LAB — TROPONIN T, HIGH SENSITIVITY
Troponin T High Sensitivity: 29 ng/L — ABNORMAL HIGH (ref 0–19)
Troponin T High Sensitivity: 25 ng/L — ABNORMAL HIGH (ref 0–19)

## 2024-05-07 LAB — LIPASE, BLOOD: Lipase: 36 U/L (ref 11–51)

## 2024-05-07 LAB — PRO BRAIN NATRIURETIC PEPTIDE: Pro Brain Natriuretic Peptide: <50 pg/mL

## 2024-05-07 MED ORDER — AMPHETAMINE-DEXTROAMPHETAMINE 20 MG PO TABS
20.0000 mg | ORAL_TABLET | Freq: Three times a day (TID) | ORAL | 0 refills | Status: AC
  Filled 2024-05-12: qty 90, 30d supply, fill #0

## 2024-05-07 NOTE — Telephone Encounter (Signed)
 Read encounter notes.

## 2024-05-07 NOTE — ED Notes (Signed)
 Save blue tube in main lab

## 2024-05-07 NOTE — ED Notes (Signed)
 Patient denies pain and is resting comfortably.

## 2024-05-07 NOTE — Discharge Instructions (Addendum)
 Your troponin levels, otherwise known as a cardiac enzyme, were mildly elevated today.  You also have evidence of an enlarged heart on your chest x-ray today.  I discussed your lab results as well as EKG with a cardiologist who is recommending that you follow-up in the outpatient setting, I have provided you with a referral to a cardiologist, their office will be in contact with you to schedule a follow-up appointment. You have persistent elevations in your liver enzymes, this is known as transaminitis.  The ultrasound of your liver/gallbladder today was normal, please discuss this lab irregularity with your primary care provider. Return to the emergency department if your symptoms worsen.

## 2024-05-07 NOTE — ED Provider Triage Note (Signed)
 Emergency Medicine Provider Triage Evaluation Note  Darren Freeman , a 44 y.o. male  was evaluated in triage.  Pt complains of multiple complaints.  Patient notes several weeks of worsening shortness of breath, particular on exertion or when lying flat.  Patient also notes some occasional chest tightness as well as swelling in his hand/wrist occasionally.  Denies any pertinent cardiac history but was told sometime in his 58s that he had enlarged heart.  Also notes abdominal distention that is uncomfortable, associated with some mild nausea but no vomiting.  Patient went to see his primary care provider yesterday and was told to come to the emergency department for further testing at that time.  Review of Systems  Positive: As above Negative: As above  Physical Exam  BP (!) 150/80 (BP Location: Left Arm)   Pulse 65   Temp 98.3 F (36.8 C) (Oral)   Resp 18   Ht 5' 9 (1.753 m)   Wt 93 kg   SpO2 98%   BMI 30.27 kg/m  Gen:   Awake, no distress   Resp:  Normal effort  MSK:   Moves extremities without difficulty  Other:  Abdominal distension  Medical Decision Making  Medically screening exam initiated at 3:59 PM.  Appropriate orders placed.  Darren Freeman was informed that the remainder of the evaluation will be completed by another provider, this initial triage assessment does not replace that evaluation, and the importance of remaining in the ED until their evaluation is complete.  On long-term testosterone  therapy   Darren Freeman SAILOR, [place] 05/07/24 1601

## 2024-05-07 NOTE — ED Provider Notes (Signed)
 " [place] EMERGENCY DEPARTMENT AT [facility] Provider Note   CSN: [id number] Arrival date & time: 05/07/24  1531     Patient presents with: Shortness of Breath, Abdominal Pain, and Leg Swelling   Darren Freeman is a 44 y.o. male.   44 year old male presenting with multiple complaints. Patient notes several weeks of worsening shortness of breath, particular on exertion or when lying flat.  Patient also notes some occasional chest tightness as well as swelling in his hands/wrists occasionally, he has not noticed any lower extremity swelling.  Denies any pertinent cardiac history but was told sometime in his 65s that he had enlarged heart, no family history of sudden cardiac death.  Also notes abdominal distention that is uncomfortable, associated with some mild nausea but no vomiting.  Patient went to see his primary care provider yesterday and was told to come to the emergency department for further testing at that time, he was told that his EKG was abnormal and that he should seek further evaluation.  He is not experiencing any chest discomfort at this time.   Shortness of Breath Associated symptoms: abdominal pain   Abdominal Pain Associated symptoms: shortness of breath        Prior to Admission medications  Medication Sig Start Date End Date Taking? Authorizing Provider  ALPRAZolam  (XANAX ) 1 MG tablet Take 1 tablet (1 mg total) by mouth 3 (three) times daily as needed. 12/09/22     ALPRAZolam  (XANAX ) 1 MG tablet Take 1 tablet (1 mg total) by mouth 3 (three) times daily as needed. 01/10/23     ALPRAZolam  (XANAX ) 1 MG tablet Take 1 tablet (1 mg total) by mouth 3 (three) times daily as needed. 08/09/23     ALPRAZolam  (XANAX ) 1 MG tablet Take 1 tablet (1 mg total) by mouth 3 (three) times daily. 04/05/24     amphetamine -dextroamphetamine  (ADDERALL) 20 MG tablet Take 1 tablet (20 mg total) by mouth 3 (three) times daily. 12/31/22     amphetamine -dextroamphetamine   (ADDERALL) 20 MG tablet Take 1 tablet (20 mg total) by mouth 3 (three) times daily. 11/30/22     amphetamine -dextroamphetamine  (ADDERALL) 20 MG tablet Take 1 tablet (20 mg total) by mouth 3 (three) times daily. 04/02/23     amphetamine -dextroamphetamine  (ADDERALL) 20 MG tablet Take 1 tablet (20 mg total) by mouth 3 (three) times daily. 04/14/23     amphetamine -dextroamphetamine  (ADDERALL) 20 MG tablet Take 1 tablet (20 mg total) by mouth 3 (three) times daily. 06/16/23     amphetamine -dextroamphetamine  (ADDERALL) 20 MG tablet Take 1 tablet (20 mg total) by mouth 3 (three) times daily. 05/17/23     amphetamine -dextroamphetamine  (ADDERALL) 20 MG tablet Take 1 tablet (20 mg total) by mouth 3 (three) times daily. 07/16/23     amphetamine -dextroamphetamine  (ADDERALL) 20 MG tablet Take 1 tablet (20 mg total) by mouth 3 (three) times daily. 04/11/24     gabapentin  (NEURONTIN ) 300 MG capsule Take 1-3 capsules (300-900 mg total) by mouth at bedtime. 01/24/23   Vincente Grip, MD  gabapentin  (NEURONTIN ) 600 MG tablet Take 1-2 tablets (600-1,200 mg total) by mouth at bedtime. 10/15/23     Multiple Vitamin (MULTIVITAMIN) tablet Take 1 tablet by mouth daily.    [provider]  Omega-3 Fatty Acids (FISH OIL OMEGA-3 PO) Take 1 capsule by mouth daily in the afternoon.    [provider]  testosterone  cypionate (DEPOTESTOSTERONE CYPIONATE) 200 MG/ML injection Inject 200 mg into the muscle 2 (two) times a week.  [provider]    Allergies: Patient has no known allergies.    Review of Systems  Respiratory:  Positive for shortness of breath.   Gastrointestinal:  Positive for abdominal pain.    Updated Vital Signs  Vitals:   05/07/24 1541 05/07/24 1830 05/07/24 2100 05/07/24 2200  BP: (!) 150/80 (!) 152/95 (!) 150/89 (!) 145/102  Pulse: 65 (!) 57 (!) 53 (!) 54  Resp: 18 20 15 14   Temp: 98.3 F (36.8 C) 98.5 F (36.9 C)    TempSrc: Oral     SpO2: 98% 98% 99% 99%  Weight: 93 kg      Height: 5' 9 (1.753 m)        Physical Exam Vitals and nursing note reviewed.  Constitutional:      General: He is not in acute distress.    Appearance: Normal appearance. He is not ill-appearing or toxic-appearing.  HENT:     Head: Normocephalic and atraumatic.  Eyes:     Extraocular Movements: Extraocular movements intact.     Pupils: Pupils are equal, round, and reactive to light.  Cardiovascular:     Rate and Rhythm: Normal rate and regular rhythm.     Pulses:          Radial pulses are 2+ on the right side and 2+ on the left side.     Heart sounds: Normal heart sounds.  Pulmonary:     Effort: Pulmonary effort is normal.     Breath sounds: Normal breath sounds.  Abdominal:     General: There is distension (mild).     Palpations: Abdomen is soft.     Tenderness: There is no abdominal tenderness. There is no guarding.  Musculoskeletal:     Cervical back: Normal range of motion.     Right lower leg: No edema.     Left lower leg: No edema.     Comments: Moves all extremities spontaneously without difficulty  Skin:    General: Skin is warm and dry.  Neurological:     General: No focal deficit present.     Mental Status: He is alert and oriented to person, place, and time.     (all labs ordered are listed, but only abnormal results are displayed) Labs Reviewed  BASIC METABOLIC PANEL WITH GFR - Abnormal; Notable for the following components:      Result Value   BUN 27 (*)    All other components within normal limits  CBC - Abnormal; Notable for the following components:   Hemoglobin 17.2 (*)    All other components within normal limits  TROPONIN T, HIGH SENSITIVITY - Abnormal; Notable for the following components:   Troponin T High Sensitivity 29 (*)    All other components within normal limits  PRO BRAIN NATRIURETIC PEPTIDE  D-DIMER, QUANTITATIVE  LIPASE, BLOOD  URINALYSIS, ROUTINE W REFLEX MICROSCOPIC  HEPATIC FUNCTION PANEL    EKG: EKG  Interpretation Date/Time:  Wednesday May 07 2024 16:08:44 EST Ventricular Rate:  58 PR Interval:  198 QRS Duration:  85 QT Interval:  366 QTC Calculation: 360 R Axis:   82  Text Interpretation: Sinus rhythm Consider left ventricular hypertrophy Anterior Q waves, possibly due to LVH Nonspecific ST abnormality Confirmed by Ula Barter [phone]) on 05/07/2024 4:12:51 PM  Radiology: US  Abdomen Limited RUQ (LIVER/GB) Result Date: 05/07/2024 EXAM: Right Upper Quadrant Abdominal Ultrasound 05/07/2024 07:25:00 PM TECHNIQUE: Real-time ultrasonography of the right upper quadrant of the abdomen was performed. COMPARISON: None available. CLINICAL HISTORY:  Transaminitis. FINDINGS: LIVER: Normal echogenicity. No intrahepatic biliary ductal dilatation. Right hepatic lobe 2.4 x 1.8 x 1.5 cm simple hepatic cyst. Hepatopetal flow in the portal vein. BILIARY SYSTEM: Negative sonographic murphy sign. No pericholecystic fluid or wall thickening. No cholelithiasis. Common bile duct is within normal limits measuring 2 mm. RIGHT KIDNEY: No hydronephrosis. No echogenic calculi. No mass. OTHER: No right upper quadrant ascites. IMPRESSION: 1. No acute right upper quadrant abnormality. Electronically signed by: Morgane Naveau MD 05/07/2024 07:43 PM EST RP Workstation: [id number]   DG Chest 2 View Result Date: 05/07/2024 EXAM: 2 VIEW(S) XRAY OF THE CHEST 05/07/2024 04:04:00 PM COMPARISON: 02/16/2021. CLINICAL HISTORY: FINDINGS: LUNGS AND PLEURA: No focal pulmonary opacity. No pleural effusion. No pneumothorax. HEART AND MEDIASTINUM: Cardiomegaly. No acute abnormality of the mediastinal silhouette. BONES AND SOFT TISSUES: No acute osseous abnormality. IMPRESSION: 1. Cardiomegaly. Electronically signed by: Elsie Gravely MD 05/07/2024 04:39 PM EST RP Workstation: [id number]     Procedures   Medications Ordered in the ED - No data to display                                  Medical Decision Making This patient  presents to the ED for concern of multiple complaints, this involves an extensive number of treatment options, and is a complaint that carries with it a high risk of complications and morbidity.  The differential diagnosis includes ACS, CHF, pneumonia, fluid volume overload, nonalcoholic fatty liver disease, cholecystitis or other gallbladder pathology   Co morbidities that complicate the patient evaluation  Hypogonadism on testosterone  therapy   Additional history obtained:  Additional history obtained from record review External records from outside source obtained and reviewed including recent PCP note   Lab Tests:  I Ordered, and personally interpreted labs.  The pertinent results include: CBC notable for hemoglobin of 17.2 which is stable from previous, no leukocytosis.  BMP with some elevation in BUN at 27, otherwise unremarkable.  Initial troponin of 29, repeat 25.  BNP normal.  D-dimer normal.  Hepatic function panel notable for elevations in AST/ALT above most recent baseline.  Lipase normal, 36.  Urinalysis within normal limits.   Imaging Studies ordered:  I ordered imaging studies including CXR, RUQ US   I independently visualized and interpreted imaging which showed  - CXR: 1. Cardiomegaly. - RUQ US : 1. No acute right upper quadrant abnormality.  I agree with the radiologist interpretation   Cardiac Monitoring: / EKG:  The patient was maintained on a cardiac monitor.  I personally viewed and interpreted the cardiac monitored which showed an underlying rhythm of: NSR   Consultations Obtained:  I requested consultation with the cardiology,  and discussed lab and imaging findings as well as pertinent plan - they recommend: I spoke with Dr. Gail with cardiology who reviewed the patient's EKG, given that patient is chest pain-free currently and troponin is elevated but downtrending, recommends outpatient follow-up.   Problem List / ED Course / Critical interventions /  Medication management I have reviewed the patients home medicines and have made adjustments as needed   Social Determinants of Health:  Former tobacco use   Test / Admission - Considered:  Physical exam is largely unremarkable as above, patient does have some mild abdominal distention but abdomen is soft and nontender on exam, no evidence of fluid volume overload elsewhere.  Patient endorsing several weeks of worsening shortness of breath and occasional chest tightness, troponins today  are mildly elevated but downtrending from 29-25.  Patient does have elevations in LFTs, right upper quadrant ultrasound was obtained for further evaluation of this and does not show any acute abnormalities.  Patient has a known history of this, is being worked up by his primary care provider. Cardiomegaly is noted on chest x-ray and patient has been told this has been present previously.  Remainder of workup is largely unremarkable, no pertinent personal or family cardiac history, patient was seen by cardiologist in his early 78s for an unknown reason.  I spoke with Dr. Gail as above, does not feel that any further intervention/workup is necessary at this time, recommending outpatient follow-up.  Patient provided with ambulatory referral to cardiology, patient understands that he should return to the emergency department in the event that his symptoms worsen.  He is appropriate for discharge at this time.     Amount and/or Complexity of Data Reviewed Labs: ordered. Radiology: ordered.        Final diagnoses:  Transaminitis  Elevated troponin  Cardiomegaly    ED Discharge Orders          Ordered    Ambulatory referral to Cardiology       Comments: If you have not heard from the Cardiology office within the next 72 hours please call [phone].   05/07/24 2211               [name] SAILOR, PA-C 05/07/24 2233    Ula Prentice SAUNDERS, MD 05/07/24 2235  "

## 2024-05-07 NOTE — ED Triage Notes (Addendum)
 Pt to ED c/o abdominal distention, SHOB worse with exertion and laying flat, and swelling in hands, feet, legs. Pt reports this has been an ongoing problem x 1 week, reports 15 pound weight gain in 2 weeks. evaluated at PCP yesterday for the same. Reports hx of elevated LFTS. Reports tightness in chest x 1 week.

## 2024-05-08 NOTE — Telephone Encounter (Signed)
 Thankfully he later went to the emergency department-see those notes

## 2024-05-12 ENCOUNTER — Other Ambulatory Visit (HOSPITAL_BASED_OUTPATIENT_CLINIC_OR_DEPARTMENT_OTHER): Payer: Self-pay
